# Patient Record
Sex: Female | Born: 1975 | Race: Black or African American | Hispanic: No | Marital: Single | Smoking: Former smoker
Health system: Southern US, Community
[De-identification: ages and names within clinical notes are randomized; demographics above are authoritative.]

## PROBLEM LIST (undated history)

## (undated) DIAGNOSIS — Z789 Other specified health status: Secondary | ICD-10-CM

## (undated) DIAGNOSIS — F419 Anxiety disorder, unspecified: Secondary | ICD-10-CM

## (undated) DIAGNOSIS — G4730 Sleep apnea, unspecified: Secondary | ICD-10-CM

## (undated) HISTORY — DX: Morbid (severe) obesity due to excess calories: E66.01

## (undated) HISTORY — DX: Anxiety disorder, unspecified: F41.9

## (undated) HISTORY — PX: LEEP: SHX91

---

## 2004-02-08 ENCOUNTER — Other Ambulatory Visit: Admission: RE | Admit: 2004-02-08 | Discharge: 2004-02-08 | Payer: Self-pay | Admitting: Gynecology

## 2004-09-08 ENCOUNTER — Other Ambulatory Visit: Admission: RE | Admit: 2004-09-08 | Discharge: 2004-09-08 | Payer: Self-pay | Admitting: Gynecology

## 2006-10-29 ENCOUNTER — Encounter: Admission: RE | Admit: 2006-10-29 | Discharge: 2006-10-29 | Payer: Self-pay | Admitting: Internal Medicine

## 2007-07-23 ENCOUNTER — Inpatient Hospital Stay (HOSPITAL_COMMUNITY): Admission: AD | Admit: 2007-07-23 | Discharge: 2007-07-26 | Payer: Self-pay | Admitting: Obstetrics and Gynecology

## 2007-09-27 ENCOUNTER — Emergency Department (HOSPITAL_COMMUNITY): Admission: EM | Admit: 2007-09-27 | Discharge: 2007-09-27 | Payer: Self-pay | Admitting: Emergency Medicine

## 2010-05-27 NOTE — Discharge Summary (Signed)
NAMEALLAYA, Rhonda Murray               ACCOUNT NO.:  [id number]   MEDICAL RECORD NO.:  [id number]          PATIENT TYPE:  INP   LOCATION:  9110                          FACILITY:  [facility]   PHYSICIAN:  Malva Limes, M.D.    DATE OF BIRTH:  [birth year]   DATE OF ADMISSION:  07/23/2007  DATE OF DISCHARGE:  07/26/2007                               DISCHARGE SUMMARY   FINAL DIAGNOSES:  1. Intrauterine gestation at 38-1/2 weeks.  2. Positive group B streptococcus.  3. Active labor.   PROCEDURE:  Vacuum-assisted vaginal delivery of a female infant with  Apgars of 9 and 9.  Delivery performed by Dr. Malva Limes.   COMPLICATIONS:  None.   This 35 year old G1, P0 presents at 38-1/2 weeks' gestation in early  labor.  The patient's antepartum course up to this point had been  uncomplicated.  She did have a positive group B strep culture obtained  in our office at 36 weeks.  The patient also had some slightly elevated  blood pressures upon admission but normal lab work and no protein in her  urine.  The patient was started on antibiotics for her positive group B  strep status.  The patient dilated to complete.  She did have some  variable decelerations and during the second stage of labor had a deep  variable deceleration.  To shorten the stage of labor, Dr. Dareen Piano used  a vacuum extractor at +3 station.  She had delivery of a 7-pound 6-ounce  female infant with Apgars of 9 and 9 over a third-degree midline  episiotomy.  There was a body cord x1.  The delivery went without  complications.  The patient's postpartum course was benign without any  significant fevers.  She did want her little boy circumcised prior to  discharge.  The patient was felt ready for discharge on postpartum day  #2.  She was able to be sent home on a regular diet, told to decrease  activities, was given Percocet 1-2 every 4 hours as needed for pain,  told she could use over-the-counter ibuprofen up to 600 mg every 6 hours  as  needed for pain, was told she could use over-the-counter stool  softeners, was to follow up in our office in 4 weeks.  Instructions and  precautions were reviewed with the patient.   LABORATORIES ON DISCHARGE:  The patient had a hemoglobin of 11.1, white  blood cell count of 11.3, and platelets of 193,000 and like I said  prior, a normal [facility] panel.      Leilani Able, P.A.-C.      ______________________________  Malva Limes, M.D.    MB/MEDQ  D:  08/22/2007  T:  08/23/2007  Job:  [id number]

## 2010-10-06 LAB — URINALYSIS, ROUTINE W REFLEX MICROSCOPIC
Bilirubin Urine: NEGATIVE
Glucose, UA: NEGATIVE
Ketones, ur: NEGATIVE
Nitrite: NEGATIVE
Protein, ur: NEGATIVE
Urobilinogen, UA: 0.2

## 2010-10-06 LAB — URINE MICROSCOPIC-ADD ON

## 2010-10-06 LAB — COMPREHENSIVE METABOLIC PANEL
AST: 23
Albumin: 2.7 — ABNORMAL LOW
Alkaline Phosphatase: 147 — ABNORMAL HIGH
CO2: 21
Chloride: 104
Creatinine, Ser: 0.88
GFR calc Af Amer: >60
Sodium: 134 — ABNORMAL LOW
Total Bilirubin: 0.6

## 2010-10-06 LAB — RPR: RPR Ser Ql: NONREACTIVE

## 2010-10-06 LAB — CBC
MCHC: 33.6
RDW: 15.1

## 2010-10-07 LAB — CBC
HCT: 33.4 — ABNORMAL LOW
MCV: 94.4
Platelets: 193

## 2012-11-26 ENCOUNTER — Ambulatory Visit
Admission: RE | Admit: 2012-11-26 | Discharge: 2012-11-26 | Disposition: A | Discharge status: Home / Self Care | Payer: 59 | Source: Ambulatory Visit | Attending: Internal Medicine | Admitting: Internal Medicine

## 2012-11-26 ENCOUNTER — Other Ambulatory Visit: Payer: Self-pay | Admitting: Internal Medicine

## 2012-11-26 DIAGNOSIS — R110 Nausea: Secondary | ICD-10-CM

## 2012-11-26 DIAGNOSIS — K921 Melena: Secondary | ICD-10-CM

## 2012-11-26 DIAGNOSIS — R109 Unspecified abdominal pain: Secondary | ICD-10-CM

## 2012-11-26 MED ORDER — IOHEXOL 300 MG/ML  SOLN
100.0000 mL | Freq: Once | INTRAMUSCULAR | Status: AC | PRN
  Administered 2012-11-26: 100 mL via INTRAVENOUS

## 2012-11-26 MED ORDER — IOHEXOL 300 MG/ML  SOLN
40.0000 mL | Freq: Once | INTRAMUSCULAR | Status: AC | PRN
  Administered 2012-11-26: 40 mL via ORAL

## 2012-12-11 ENCOUNTER — Other Ambulatory Visit: Payer: Self-pay | Admitting: Family

## 2012-12-11 ENCOUNTER — Ambulatory Visit (INDEPENDENT_AMBULATORY_CARE_PROVIDER_SITE_OTHER): Payer: 59 | Admitting: Family

## 2012-12-11 ENCOUNTER — Encounter: Payer: Self-pay | Admitting: Family

## 2012-12-11 VITALS — BP 118/70 | HR 98 | Ht 67.0 in | Wt 261.0 lb

## 2012-12-11 DIAGNOSIS — R1084 Generalized abdominal pain: Secondary | ICD-10-CM

## 2012-12-11 DIAGNOSIS — N926 Irregular menstruation, unspecified: Secondary | ICD-10-CM

## 2012-12-11 LAB — CBC WITH DIFFERENTIAL/PLATELET
Eosinophils Absolute: 0.1 10*3/uL (ref 0.0–0.7)
Eosinophils Relative: 0.9 % (ref 0.0–5.0)
HCT: 39.8 % (ref 36.0–46.0)
Hemoglobin: 13.2 g/dL (ref 12.0–15.0)
Lymphocytes Relative: 25.2 % (ref 12.0–46.0)
Lymphs Abs: 1.9 10*3/uL (ref 0.7–4.0)
MCHC: 33.3 g/dL (ref 30.0–36.0)
Monocytes Relative: 5 % (ref 3.0–12.0)
Neutrophils Relative %: 68.6 % (ref 43.0–77.0)
Platelets: 281 10*3/uL (ref 150.0–400.0)

## 2012-12-11 LAB — BASIC METABOLIC PANEL
CO2: 24 mEq/L (ref 19–32)
Creatinine, Ser: 0.8 mg/dL (ref 0.4–1.2)
Glucose, Bld: 81 mg/dL (ref 70–99)
Potassium: 3.1 mEq/L — ABNORMAL LOW (ref 3.5–5.1)
Sodium: 135 mEq/L (ref 135–145)

## 2012-12-11 LAB — LIPID PANEL
Cholesterol: 174 mg/dL (ref 0–200)
LDL Cholesterol: 95 mg/dL (ref 0–99)
Total CHOL/HDL Ratio: 3
Triglycerides: 102 mg/dL (ref 0.0–149.0)

## 2012-12-11 LAB — HEPATIC FUNCTION PANEL
ALT: 21 U/L (ref 0–35)
Total Bilirubin: 0.7 mg/dL (ref 0.3–1.2)
Total Protein: 8.1 g/dL (ref 6.0–8.3)

## 2012-12-11 LAB — POCT URINALYSIS DIPSTICK
Bilirubin, UA: NEGATIVE
Nitrite, UA: NEGATIVE
Urobilinogen, UA: 1
pH, UA: 6.5

## 2012-12-11 MED ORDER — POTASSIUM CHLORIDE CRYS ER 20 MEQ PO TBCR: 20.0000 meq | EXTENDED_RELEASE_TABLET | Freq: Every day | ORAL | Status: DC

## 2012-12-11 NOTE — Patient Instructions (Signed)
Align once a day.   Irritable Bowel Syndrome Irritable Bowel Syndrome (IBS) is caused by a disturbance of normal bowel function. Other terms used are spastic colon, mucous colitis, and irritable colon. It does not require surgery, nor does it lead to cancer. There is no cure for IBS. But with proper diet, stress reduction, and medication, you will find that your problems (symptoms) will gradually disappear or improve. IBS is a common digestive disorder. It usually appears in late adolescence or early adulthood. Women develop it twice as often as men. CAUSES  After food has been digested and absorbed in the small intestine, waste material is moved into the colon (large intestine). In the colon, water and salts are absorbed from the undigested products coming from the small intestine. The remaining residue, or fecal material, is held for elimination. Under normal circumstances, gentle, rhythmic contractions on the bowel walls push the fecal material along the colon towards the rectum. In IBS, however, these contractions are irregular and poorly coordinated. The fecal material is either retained too long, resulting in constipation, or expelled too soon, producing diarrhea. SYMPTOMS  The most common symptom of IBS is pain. It is typically in the lower left side of the belly (abdomen). But it may occur anywhere in the abdomen. It can be felt as heartburn, backache, or even as a dull pain in the arms or shoulders. The pain comes from excessive bowel-muscle spasms and from the buildup of gas and fecal material in the colon. This pain:  Can range from sharp belly (abdominal) cramps to a dull, continuous ache.  Usually worsens soon after eating.  Is typically relieved by having a bowel movement or passing gas. Abdominal pain is usually accompanied by constipation. But it may also produce diarrhea. The diarrhea typically occurs right after a meal or upon arising in the morning. The stools are typically soft and  watery. They are often flecked with secretions (mucus). Other symptoms of IBS include:  Bloating.  Loss of appetite.  Heartburn.  Feeling sick to your stomach (nausea).  Belching  Vomiting  Gas. IBS may also cause a number of symptoms that are unrelated to the digestive system:  Fatigue.  Headaches.  Anxiety  Shortness of breath  Difficulty in concentrating.  Dizziness. These symptoms tend to come and go. DIAGNOSIS  The symptoms of IBS closely mimic the symptoms of other, more serious digestive disorders. So your caregiver may wish to perform a variety of additional tests to exclude these disorders. He/she wants to be certain of learning what is wrong (diagnosis). The nature and purpose of each test will be explained to you. TREATMENT A number of medications are available to help correct bowel function and/or relieve bowel spasms and abdominal pain. Among the drugs available are:  Mild, non-irritating laxatives for severe constipation and to help restore normal bowel habits.  Specific anti-diarrheal medications to treat severe or prolonged diarrhea.  Anti-spasmodic agents to relieve intestinal cramps.  Your caregiver may also decide to treat you with a mild tranquilizer or sedative during unusually stressful periods in your life. The important thing to remember is that if any drug is prescribed for you, make sure that you take it exactly as directed. Make sure that your caregiver knows how well it worked for you. HOME CARE INSTRUCTIONS   Avoid foods that are high in fat or oils. Some examples NFA:OZHYQ cream, butter, frankfurters, sausage, and other fatty meats.  Avoid foods that have a laxative effect, such as fruit, fruit juice,  and dairy products.  Cut out carbonated drinks, chewing gum, and "gassy" foods, such as beans and cabbage. This may help relieve bloating and belching.  Bran taken with plenty of liquids may help relieve constipation.  Keep track of what  foods seem to trigger your symptoms.  Avoid emotionally charged situations or circumstances that produce anxiety.  Start or continue exercising.  Get plenty of rest and sleep. MAKE SURE YOU:   Understand these instructions.  Will watch your condition.  Will get help right away if you are not doing well or get worse. Document Released: 12/26/2004 Document Revised: 03/20/2011 Document Reviewed: 08/16/2007 [facility] Patient Information 2014 [place], [place].

## 2012-12-11 NOTE — Addendum Note (Signed)
Addended byAdline Mango B on: 12/11/2012 03:37 PM   Modules accepted: Orders

## 2012-12-11 NOTE — Progress Notes (Signed)
   Subjective:    Patient ID: Rhonda Murray, female    DOB: June 16, 1975, 37 y.o.   MRN: [id number]  HPI 37 year old [place] American female, new patient to the practice and to be established. She was seen last month at emergency department with abdominal pain and had a CT scan of the abdomen done that showed no acute findings. It was positive for gallstones but no cholecystitis. She continues to have generalized abdominal pain she describes as a constant dull ache, mild. However, once or twice a day she'll have a sharp pain that lasts 10-15 seconds and raised the pain is 6-7/10. Has taken Aleve but doesn't really help her symptoms. Reports constipation but denies any diarrhea, blood in her stools are dark black stools. She is sexually active for one mild partner, reports always protected. Last menstrual period October 2014. The reports irregularity in her menstrual cycle. She has not had a Pap smear in 2 years.  Reports increased stress due to work.   Review of Systems  Constitutional: Negative.   HENT: Negative.   Respiratory: Negative.   Cardiovascular: Negative.   Gastrointestinal: Negative.   Endocrine: Negative.   Musculoskeletal: Negative.   Neurological: Negative.   Psychiatric/Behavioral: Negative.    History reviewed. No pertinent past medical history.  History   Social History  . Marital Status: Married    Spouse Name: N/A    Number of Children: N/A  . Years of Education: N/A   Occupational History  . Not on file.   Social History Main Topics  . Smoking status: Former Games developer  . Smokeless tobacco: Not on file  . Alcohol Use: Yes  . Drug Use: No  . Sexual Activity: Not on file   Other Topics Concern  . Not on file   Social History Narrative  . No narrative on file    History reviewed. No pertinent past surgical history.  Family History  Problem Relation Age of Onset  . Stroke Mother   . Hypertension Mother     Not on File  No current outpatient  prescriptions on file prior to visit.   No current facility-administered medications on file prior to visit.    BP 118/70  Pulse 98  Ht 5\' 7"  (1.702 m)  Wt 261 lb (118.389 kg)  BMI 40.87 kg/m2  LMP 10/13/2014chart    Objective:   Physical Exam  Constitutional: She is oriented to person, place, and time. She appears well-developed and well-nourished.  HENT:  Right Ear: External ear normal.  Left Ear: External ear normal.  Nose: Nose normal.  Mouth/Throat: Oropharynx is clear and moist.  Neck: Normal range of motion. Neck supple.  Cardiovascular: Normal rate, regular rhythm and normal heart sounds.   Pulmonary/Chest: Effort normal and breath sounds normal.  Abdominal: Soft. Bowel sounds are normal.  Musculoskeletal: Normal range of motion.  Neurological: She is alert and oriented to person, place, and time.  Skin: Skin is warm and dry.  Psychiatric: She has a normal mood and affect.          Assessment & Plan:  Assessment:  1. Generalized Abdominal Pain  2. IBS  Plan: Lab sent today to include BMP, CBC, LFTs, UA, urine pregnancy test will notify patient of results. Encouraged over-the-counter probiotic once daily. Return for complete physical exam. I believe that the abdominal pain is strongly linked to stress. Encouraged exercise. Will followup at her physical to be sure she is doing better if not refer to GI.

## 2012-12-12 LAB — URINE CULTURE: Colony Count: NO GROWTH

## 2012-12-26 ENCOUNTER — Ambulatory Visit (INDEPENDENT_AMBULATORY_CARE_PROVIDER_SITE_OTHER): Payer: 59 | Admitting: Family

## 2012-12-26 ENCOUNTER — Other Ambulatory Visit (HOSPITAL_COMMUNITY)
Admission: RE | Admit: 2012-12-26 | Discharge: 2012-12-26 | Disposition: A | Discharge status: Home / Self Care | Payer: 59 | Source: Ambulatory Visit | Attending: Family | Admitting: Family

## 2012-12-26 ENCOUNTER — Encounter: Payer: Self-pay | Admitting: Family

## 2012-12-26 VITALS — BP 118/76 | HR 87 | Ht 67.0 in | Wt 260.0 lb

## 2012-12-26 DIAGNOSIS — Z23 Encounter for immunization: Secondary | ICD-10-CM

## 2012-12-26 DIAGNOSIS — Z331 Pregnant state, incidental: Secondary | ICD-10-CM

## 2012-12-26 DIAGNOSIS — Z01419 Encounter for gynecological examination (general) (routine) without abnormal findings: Secondary | ICD-10-CM | POA: Insufficient documentation

## 2012-12-26 DIAGNOSIS — Z0000 Encounter for general adult medical examination without abnormal findings: Secondary | ICD-10-CM

## 2012-12-26 DIAGNOSIS — Z113 Encounter for screening for infections with a predominantly sexual mode of transmission: Secondary | ICD-10-CM | POA: Insufficient documentation

## 2012-12-26 DIAGNOSIS — Z3490 Encounter for supervision of normal pregnancy, unspecified, unspecified trimester: Secondary | ICD-10-CM

## 2012-12-26 DIAGNOSIS — E876 Hypokalemia: Secondary | ICD-10-CM | POA: Insufficient documentation

## 2012-12-26 DIAGNOSIS — N939 Abnormal uterine and vaginal bleeding, unspecified: Secondary | ICD-10-CM

## 2012-12-26 DIAGNOSIS — Z124 Encounter for screening for malignant neoplasm of cervix: Secondary | ICD-10-CM

## 2012-12-26 DIAGNOSIS — N898 Other specified noninflammatory disorders of vagina: Secondary | ICD-10-CM

## 2012-12-26 NOTE — Patient Instructions (Signed)
Pregnancy - First Trimester  During sexual intercourse, millions of sperm go into the vagina. Only 1 sperm will penetrate and fertilize the female egg while it is in the Fallopian tube. One week later, the fertilized egg implants into the wall of the uterus. An embryo begins to develop into a baby. At 6 to 8 weeks, the eyes and face are formed and the heartbeat can be seen on ultrasound. At the end of 12 weeks (first trimester), all the baby's organs are formed. Now that you are pregnant, you will want to do everything you can to have a healthy baby. Two of the most important things are to get good prenatal care and follow your caregiver's instructions. Prenatal care is all the medical care you receive before the baby's birth. It is given to prevent, find, and treat problems during the pregnancy and childbirth.  PRENATAL EXAMS  · During prenatal visits, your weight, blood pressure, and urine are checked. This is done to make sure you are healthy and progressing normally during the pregnancy.  · A pregnant woman should gain 25 to 35 pounds during the pregnancy. However, if you are overweight or underweight, your caregiver will advise you regarding your weight.  · Your caregiver will ask and answer questions for you.  · Blood work, cervical cultures, other necessary tests, and a Pap test are done during your prenatal exams. These tests are done to check on your health and the probable health of your baby. Tests are strongly recommended and done for HIV with your permission. This is the virus that causes AIDS. These tests are done because medicines can be given to help prevent your baby from being born with this infection should you have been infected without knowing it. Blood work is also used to find out your blood type, previous infections, and follow your blood levels (hemoglobin).  · Low hemoglobin (anemia) is common during pregnancy. Iron and vitamins are given to help prevent this. Later in the pregnancy, blood  tests for diabetes will be done along with any other tests if any problems develop.  · You may need other tests to make sure you and the baby are doing well.  CHANGES DURING THE FIRST TRIMESTER   Your body goes through many changes during pregnancy. They vary from person to person. Talk to your caregiver about changes you notice and are concerned about. Changes can include:  · Your menstrual period stops.  · The egg and sperm carry the genes that determine what you look like. Genes from you and your partner are forming a baby. The female genes determine whether the baby is a boy or a girl.  · Your body increases in girth and you may feel bloated.  · Feeling sick to your stomach (nauseous) and throwing up (vomiting). If the vomiting is uncontrollable, call your caregiver.  · Your breasts will begin to enlarge and become tender.  · Your nipples may stick out more and become darker.  · The need to urinate more. Painful urination may mean you have a bladder infection.  · Tiring easily.  · Loss of appetite.  · Cravings for certain kinds of food.  · At first, you may gain or lose a couple of pounds.  · You may have changes in your emotions from day to day (excited to be pregnant or concerned something may go wrong with the pregnancy and baby).  · You may have more vivid and strange dreams.  HOME CARE INSTRUCTIONS   ·   It is very important to avoid all smoking, alcohol and non-prescribed drugs during your pregnancy. These affect the formation and growth of the baby. Avoid chemicals while pregnant to ensure the delivery of a healthy infant.  · Start your prenatal visits by the 12th week of pregnancy. They are usually scheduled monthly at first, then more often in the last 2 months before delivery. Keep your caregiver's appointments. Follow your caregiver's instructions regarding medicine use, blood and lab tests, exercise, and diet.  · During pregnancy, you are providing food for you and your baby. Eat regular, well-balanced  meals. Choose foods such as meat, fish, milk and other low fat dairy products, vegetables, fruits, and whole-grain breads and cereals. Your caregiver will tell you of the ideal weight gain.  · You can help morning sickness by keeping soda crackers at the bedside. Eat a couple before arising in the morning. You may want to use the crackers without salt on them.  · Eating 4 to 5 small meals rather than 3 large meals a day also may help the nausea and vomiting.  · Drinking liquids between meals instead of during meals also seems to help nausea and vomiting.  · A physical sexual relationship may be continued throughout pregnancy if there are no other problems. Problems may be early (premature) leaking of amniotic fluid from the membranes, vaginal bleeding, or belly (abdominal) pain.  · Exercise regularly if there are no restrictions. Check with your caregiver or physical therapist if you are unsure of the safety of some of your exercises. Greater weight gain will occur in the last 2 trimesters of pregnancy. Exercising will help:  · Control your weight.  · Keep you in shape.  · Prepare you for labor and delivery.  · Help you lose your pregnancy weight after you deliver your baby.  · Wear a good support or jogging bra for breast tenderness during pregnancy. This may help if worn during sleep too.  · Ask when prenatal classes are available. Begin classes when they are offered.  · Do not use hot tubs, steam rooms, or saunas.  · Wear your seat belt when driving. This protects you and your baby if you are in an accident.  · Avoid raw meat, uncooked cheese, cat litter boxes, and soil used by cats throughout the pregnancy. These carry germs that can cause birth defects in the baby.  · The first trimester is a good time to visit your dentist for your dental health. Getting your teeth cleaned is okay. Use a softer toothbrush and brush gently during pregnancy.  · Ask for help if you have financial, counseling, or nutritional needs  during pregnancy. Your caregiver will be able to offer counseling for these needs as well as refer you for other special needs.  · Do not take any medicines or herbs unless told by your caregiver.  · Inform your caregiver if there is any mental or physical domestic violence.  · Make a list of emergency phone numbers of family, friends, hospital, and police and fire departments.  · Write down your questions. Take them to your prenatal visit.  · Do not douche.  · Do not cross your legs.  · If you have to stand for long periods of time, rotate you feet or take small steps in a circle.  · You may have more vaginal secretions that may require a sanitary pad. Do not use tampons or scented sanitary pads.  MEDICINES AND DRUG USE IN PREGNANCY  ·   Take prenatal vitamins as directed. The vitamin should contain 1 milligram of folic acid. Keep all vitamins out of reach of children. Only a couple vitamins or tablets containing iron may be fatal to a baby or young child when ingested.  · Avoid use of all medicines, including herbs, over-the-counter medicines, not prescribed or suggested by your caregiver. Only take over-the-counter or prescription medicines for pain, discomfort, or fever as directed by your caregiver. Do not use aspirin, ibuprofen, or naproxen unless directed by your caregiver.  · Let your caregiver also know about herbs you may be using.  · Alcohol is related to a number of birth defects. This includes fetal alcohol syndrome. All alcohol, in any form, should be avoided completely. Smoking will cause low birth rate and premature babies.  · Street or illegal drugs are very harmful to the baby. They are absolutely forbidden. A baby born to an addicted mother will be addicted at birth. The baby will go through the same withdrawal an adult does.  · Let your caregiver know about any medicines that you have to take and for what reason you take them.  SEEK MEDICAL CARE IF:   You have any concerns or worries during your  pregnancy. It is better to call with your questions if you feel they cannot wait, rather than worry about them.  SEEK IMMEDIATE MEDICAL CARE IF:   · An unexplained oral temperature above 102° F (38.9° C) develops, or as your caregiver suggests.  · You have leaking of fluid from the vagina (birth canal). If leaking membranes are suspected, take your temperature and inform your caregiver of this when you call.  · There is vaginal spotting or bleeding. Notify your caregiver of the amount and how many pads are used.  · You develop a bad smelling vaginal discharge with a change in the color.  · You continue to feel sick to your stomach (nauseated) and have no relief from remedies suggested. You vomit blood or coffee ground-like materials.  · You lose more than 2 pounds of weight in 1 week.  · You gain more than 2 pounds of weight in 1 week and you notice swelling of your face, hands, feet, or legs.  · You gain 5 pounds or more in 1 week (even if you do not have swelling of your hands, face, legs, or feet).  · You get exposed to German measles and have never had them.  · You are exposed to fifth disease or chickenpox.  · You develop belly (abdominal) pain. Round ligament discomfort is a common non-cancerous (benign) cause of abdominal pain in pregnancy. Your caregiver still must evaluate this.  · You develop headache, fever, diarrhea, pain with urination, or shortness of breath.  · You fall or are in a car accident or have any kind of trauma.  · There is mental or physical violence in your home.  Document Released: 12/20/2000 Document Revised: 09/20/2011 Document Reviewed: 06/23/2008  [employer]® Patient Information ©2014 [employer].

## 2012-12-26 NOTE — Progress Notes (Signed)
Pre visit review using our clinic review tool, if applicable. No additional management support is needed unless otherwise documented below in the visit note. 

## 2012-12-26 NOTE — Progress Notes (Signed)
Subjective:    Patient ID: Rhonda Murray, female    DOB: 1975-11-17, 37 y.o.   MRN: [id number]  HPI 37 year old [place] American female, nonsmoker is in for a complete physical exam. At her last office visit we identified that she was pregnant. She has since been in touch with gynecology and has had an initial appointment with an RN. At that time, she reported having some vaginal spotting that they deemed normal. However, the bleeding has increased. Has mild clotting. Denies any pain. She is not currently exercising or following any particular diet. EDD July 19th, 2015   Review of Systems  Constitutional: Negative.   HENT: Negative.   Eyes: Negative.   Respiratory: Negative.   Cardiovascular: Negative.   Gastrointestinal: Negative.   Endocrine: Negative.   Genitourinary: Negative.   Musculoskeletal: Negative.   Skin: Negative.   Allergic/Immunologic: Negative.   Neurological: Negative.   Hematological: Negative.   Psychiatric/Behavioral: Negative.    History reviewed. No pertinent past medical history.  History   Social History  . Marital Status: Married    Spouse Name: N/A    Number of Children: N/A  . Years of Education: N/A   Occupational History  . Not on file.   Social History Main Topics  . Smoking status: Former Games developer  . Smokeless tobacco: Not on file  . Alcohol Use: Yes  . Drug Use: No  . Sexual Activity: Not on file   Other Topics Concern  . Not on file   Social History Narrative  . No narrative on file    History reviewed. No pertinent past surgical history.  Family History  Problem Relation Age of Onset  . Stroke Mother   . Hypertension Mother     No Known Allergies  Current Outpatient Prescriptions on File Prior to Visit  Medication Sig Dispense Refill  . potassium chloride SA (K-DUR,KLOR-CON) 20 MEQ tablet Take 1 tablet (20 mEq total) by mouth daily.  30 tablet  0   No current facility-administered medications on file prior to visit.     BP 118/76  Pulse 87  Ht 5\' 7"  (1.702 m)  Wt 260 lb (117.935 kg)  BMI 40.71 kg/m2  SpO2 99%  LMP 10/13/2014chart    Objective:   Physical Exam  Constitutional: She is oriented to person, place, and time. She appears well-developed and well-nourished.  HENT:  Head: Normocephalic and atraumatic.  Right Ear: External ear normal.  Left Ear: External ear normal.  Nose: Nose normal.  Mouth/Throat: Oropharynx is clear and moist.  Eyes: Conjunctivae and EOM are normal. Pupils are equal, round, and reactive to light.  Neck: Normal range of motion. Neck supple. No thyromegaly present.  Cardiovascular: Normal rate, regular rhythm and normal heart sounds.   Pulmonary/Chest: Effort normal and breath sounds normal.  Abdominal: Soft. Bowel sounds are normal. She exhibits no distension. There is no tenderness. There is no rebound.  Genitourinary: Vagina normal and uterus normal. No vaginal discharge found.  Musculoskeletal: Normal range of motion. She exhibits no edema and no tenderness.  Neurological: She is alert and oriented to person, place, and time. She has normal reflexes. She displays normal reflexes. No cranial nerve deficit. Coordination normal.  Skin: Skin is warm and dry.  Psychiatric: She has a normal mood and affect.          Assessment & Plan:  Assessment: 1. Complete physical exam 2. Hypokalemia 3. Vaginal bleeding 4. Pregnancy  Plan: Patient advised to contact gynecology and to let him  know that the bleeding has increased. Meanwhile, I will send a serum hCG quantitative and notify patient of results. Forward results over to gynecology. Drink plenty of water. potassium daily.

## 2012-12-27 ENCOUNTER — Other Ambulatory Visit: Payer: Self-pay | Admitting: Family

## 2012-12-27 MED ORDER — METRONIDAZOLE 500 MG PO TABS: 500.0000 mg | ORAL_TABLET | Freq: Two times a day (BID) | ORAL | Status: DC

## 2012-12-30 ENCOUNTER — Telehealth: Payer: Self-pay

## 2012-12-30 NOTE — Telephone Encounter (Signed)
Spoke with Dr. Debria Garret assistant to advise of pt's dx of trich. Pt was advised by [facility]

## 2013-11-10 ENCOUNTER — Encounter: Payer: Self-pay | Admitting: Family

## 2014-10-21 ENCOUNTER — Other Ambulatory Visit (HOSPITAL_COMMUNITY): Payer: Self-pay | Admitting: Obstetrics and Gynecology

## 2014-10-21 DIAGNOSIS — O283 Abnormal ultrasonic finding on antenatal screening of mother: Secondary | ICD-10-CM

## 2014-10-21 DIAGNOSIS — Z3A33 33 weeks gestation of pregnancy: Secondary | ICD-10-CM

## 2014-10-22 ENCOUNTER — Encounter (HOSPITAL_COMMUNITY): Payer: Self-pay

## 2014-10-22 ENCOUNTER — Ambulatory Visit (HOSPITAL_COMMUNITY)
Admission: RE | Admit: 2014-10-22 | Discharge: 2014-10-22 | Disposition: A | Discharge status: Home / Self Care | Payer: 59 | Source: Ambulatory Visit | Attending: Obstetrics and Gynecology | Admitting: Obstetrics and Gynecology

## 2014-10-22 DIAGNOSIS — Z3A33 33 weeks gestation of pregnancy: Secondary | ICD-10-CM | POA: Diagnosis not present

## 2014-10-22 DIAGNOSIS — O283 Abnormal ultrasonic finding on antenatal screening of mother: Secondary | ICD-10-CM | POA: Insufficient documentation

## 2014-10-22 HISTORY — DX: Other specified health status: Z78.9

## 2014-10-23 ENCOUNTER — Other Ambulatory Visit (HOSPITAL_COMMUNITY): Payer: Self-pay | Admitting: Obstetrics and Gynecology

## 2014-11-09 ENCOUNTER — Encounter (HOSPITAL_COMMUNITY)
Admission: AD | Disposition: A | Discharge status: Home / Self Care | Payer: Self-pay | Source: Ambulatory Visit | Attending: Obstetrics and Gynecology

## 2014-11-09 ENCOUNTER — Inpatient Hospital Stay (HOSPITAL_COMMUNITY): Payer: 59 | Admitting: Anesthesiology

## 2014-11-09 ENCOUNTER — Encounter (HOSPITAL_COMMUNITY): Payer: Self-pay

## 2014-11-09 ENCOUNTER — Inpatient Hospital Stay (HOSPITAL_COMMUNITY)
Admission: AD | Admit: 2014-11-09 | Discharge: 2014-11-12 | DRG: 765 | Disposition: A | Discharge status: Home / Self Care | Payer: 59 | Source: Ambulatory Visit | Attending: Obstetrics and Gynecology | Admitting: Obstetrics and Gynecology

## 2014-11-09 ENCOUNTER — Inpatient Hospital Stay (HOSPITAL_COMMUNITY): Payer: 59

## 2014-11-09 DIAGNOSIS — R1110 Vomiting, unspecified: Secondary | ICD-10-CM | POA: Diagnosis not present

## 2014-11-09 DIAGNOSIS — Z98891 History of uterine scar from previous surgery: Secondary | ICD-10-CM

## 2014-11-09 DIAGNOSIS — D259 Leiomyoma of uterus, unspecified: Secondary | ICD-10-CM | POA: Diagnosis present

## 2014-11-09 DIAGNOSIS — O99214 Obesity complicating childbirth: Secondary | ICD-10-CM | POA: Diagnosis present

## 2014-11-09 DIAGNOSIS — O42913 Preterm premature rupture of membranes, unspecified as to length of time between rupture and onset of labor, third trimester: Principal | ICD-10-CM | POA: Diagnosis present

## 2014-11-09 DIAGNOSIS — O321XX0 Maternal care for breech presentation, not applicable or unspecified: Secondary | ICD-10-CM | POA: Diagnosis present

## 2014-11-09 DIAGNOSIS — Z6841 Body Mass Index (BMI) 40.0 and over, adult: Secondary | ICD-10-CM

## 2014-11-09 DIAGNOSIS — Z3A35 35 weeks gestation of pregnancy: Secondary | ICD-10-CM

## 2014-11-09 DIAGNOSIS — Z87891 Personal history of nicotine dependence: Secondary | ICD-10-CM | POA: Diagnosis not present

## 2014-11-09 DIAGNOSIS — O43813 Placental infarction, third trimester: Secondary | ICD-10-CM | POA: Diagnosis present

## 2014-11-09 DIAGNOSIS — Z3689 Encounter for other specified antenatal screening: Secondary | ICD-10-CM

## 2014-11-09 LAB — URINALYSIS, ROUTINE W REFLEX MICROSCOPIC
Bilirubin Urine: NEGATIVE
Glucose, UA: NEGATIVE mg/dL
HGB URINE DIPSTICK: NEGATIVE
Ketones, ur: NEGATIVE mg/dL
Leukocytes, UA: NEGATIVE
Nitrite: NEGATIVE
PH: 6.5 (ref 5.0–8.0)
Protein, ur: NEGATIVE mg/dL
SPECIFIC GRAVITY, URINE: 1.01 (ref 1.005–1.030)
UROBILINOGEN UA: 0.2 mg/dL (ref 0.0–1.0)

## 2014-11-09 LAB — CBC
HCT: 33.3 % — ABNORMAL LOW (ref 36.0–46.0)
HEMOGLOBIN: 11.3 g/dL — AB (ref 12.0–15.0)
MCH: 29.8 pg (ref 26.0–34.0)
MCHC: 33.9 g/dL (ref 30.0–36.0)
MCV: 87.9 fL (ref 78.0–100.0)
Platelets: 239 10*3/uL (ref 150–400)
RBC: 3.79 MIL/uL — AB (ref 3.87–5.11)
RDW: 15.9 % — ABNORMAL HIGH (ref 11.5–15.5)
WBC: 10 10*3/uL (ref 4.0–10.5)

## 2014-11-09 LAB — TYPE AND SCREEN
ABO/RH(D): O POS
Antibody Screen: NEGATIVE

## 2014-11-09 LAB — ABO/RH: ABO/RH(D): O POS

## 2014-11-09 LAB — RPR: RPR: NONREACTIVE

## 2014-11-09 LAB — POCT FERN TEST: POCT FERN TEST: POSITIVE

## 2014-11-09 SURGERY — Surgical Case
Anesthesia: Spinal

## 2014-11-09 MED ORDER — FENTANYL CITRATE (PF) 100 MCG/2ML IJ SOLN
INTRAMUSCULAR | Status: AC
  Filled 2014-11-09: qty 4

## 2014-11-09 MED ORDER — DEXTROSE 5 % IV SOLN
3.0000 g | INTRAVENOUS | Status: AC
  Administered 2014-11-09: 3 g via INTRAVENOUS
  Filled 2014-11-09: qty 3000

## 2014-11-09 MED ORDER — SENNOSIDES-DOCUSATE SODIUM 8.6-50 MG PO TABS
2.0000 | ORAL_TABLET | ORAL | Status: DC
  Administered 2014-11-10 – 2014-11-11 (×2): 2 via ORAL
  Filled 2014-11-09 (×3): qty 2

## 2014-11-09 MED ORDER — FENTANYL CITRATE (PF) 100 MCG/2ML IJ SOLN
INTRAMUSCULAR | Status: DC | PRN
  Administered 2014-11-09: 20 ug via INTRATHECAL

## 2014-11-09 MED ORDER — OXYCODONE-ACETAMINOPHEN 5-325 MG PO TABS
1.0000 | ORAL_TABLET | ORAL | Status: DC | PRN
  Administered 2014-11-11: 1 via ORAL
  Filled 2014-11-09: qty 1

## 2014-11-09 MED ORDER — OXYTOCIN 10 UNIT/ML IJ SOLN
INTRAMUSCULAR | Status: AC
  Filled 2014-11-09: qty 4

## 2014-11-09 MED ORDER — PHENYLEPHRINE HCL 10 MG/ML IJ SOLN
INTRAMUSCULAR | Status: DC | PRN
  Administered 2014-11-09: 40 ug via INTRAVENOUS
  Administered 2014-11-09 (×2): 80 ug via INTRAVENOUS

## 2014-11-09 MED ORDER — DIPHENHYDRAMINE HCL 25 MG PO CAPS: 25.0000 mg | ORAL_CAPSULE | Freq: Four times a day (QID) | ORAL | Status: DC | PRN

## 2014-11-09 MED ORDER — ZOLPIDEM TARTRATE 5 MG PO TABS: 5.0000 mg | ORAL_TABLET | Freq: Every evening | ORAL | Status: DC | PRN

## 2014-11-09 MED ORDER — MENTHOL 3 MG MT LOZG: 1.0000 | LOZENGE | OROMUCOSAL | Status: DC | PRN

## 2014-11-09 MED ORDER — LACTATED RINGERS IV BOLUS (SEPSIS): 500.0000 mL | Freq: Once | INTRAVENOUS | Status: DC

## 2014-11-09 MED ORDER — SIMETHICONE 80 MG PO CHEW: 80.0000 mg | CHEWABLE_TABLET | ORAL | Status: DC | PRN

## 2014-11-09 MED ORDER — ACETAMINOPHEN 325 MG PO TABS
650.0000 mg | ORAL_TABLET | ORAL | Status: DC | PRN
Start: 2014-11-09 — End: 2014-11-12
  Administered 2014-11-09 – 2014-11-10 (×3): 650 mg via ORAL
  Filled 2014-11-09 (×3): qty 2

## 2014-11-09 MED ORDER — SIMETHICONE 80 MG PO CHEW
80.0000 mg | CHEWABLE_TABLET | ORAL | Status: DC
  Administered 2014-11-10 – 2014-11-11 (×3): 80 mg via ORAL
  Filled 2014-11-09 (×3): qty 1

## 2014-11-09 MED ORDER — MEPERIDINE HCL 25 MG/ML IJ SOLN: 6.2500 mg | INTRAMUSCULAR | Status: DC | PRN

## 2014-11-09 MED ORDER — MORPHINE SULFATE (PF) 0.5 MG/ML IJ SOLN
INTRAMUSCULAR | Status: DC | PRN
  Administered 2014-11-09: .2 mg via INTRATHECAL

## 2014-11-09 MED ORDER — LACTATED RINGERS IV BOLUS (SEPSIS)
1000.0000 mL | Freq: Once | INTRAVENOUS | Status: AC
  Administered 2014-11-09: 1000 mL via INTRAVENOUS

## 2014-11-09 MED ORDER — CITRIC ACID-SODIUM CITRATE 334-500 MG/5ML PO SOLN
30.0000 mL | Freq: Once | ORAL | Status: AC
  Administered 2014-11-09: 30 mL via ORAL

## 2014-11-09 MED ORDER — DEXAMETHASONE SODIUM PHOSPHATE 10 MG/ML IJ SOLN
INTRAMUSCULAR | Status: DC | PRN
  Administered 2014-11-09: 10 mg via INTRAVENOUS

## 2014-11-09 MED ORDER — ERYTHROMYCIN 5 MG/GM OP OINT
TOPICAL_OINTMENT | OPHTHALMIC | Status: AC
  Filled 2014-11-09: qty 1

## 2014-11-09 MED ORDER — LACTATED RINGERS IV SOLN
INTRAVENOUS | Status: DC
  Administered 2014-11-09: 08:00:00 via INTRAVENOUS

## 2014-11-09 MED ORDER — ONDANSETRON HCL 4 MG/2ML IJ SOLN: 4.0000 mg | Freq: Three times a day (TID) | INTRAMUSCULAR | Status: DC | PRN

## 2014-11-09 MED ORDER — OXYCODONE-ACETAMINOPHEN 5-325 MG PO TABS: 2.0000 | ORAL_TABLET | ORAL | Status: DC | PRN

## 2014-11-09 MED ORDER — OXYTOCIN 10 UNIT/ML IJ SOLN
40.0000 [IU] | INTRAMUSCULAR | Status: DC | PRN
  Administered 2014-11-09: 40 [IU] via INTRAVENOUS

## 2014-11-09 MED ORDER — DIBUCAINE 1 % RE OINT: 1.0000 "application " | TOPICAL_OINTMENT | RECTAL | Status: DC | PRN

## 2014-11-09 MED ORDER — NALBUPHINE HCL 10 MG/ML IJ SOLN: 5.0000 mg | Freq: Once | INTRAMUSCULAR | Status: DC | PRN

## 2014-11-09 MED ORDER — SODIUM CHLORIDE 0.9 % IJ SOLN: 3.0000 mL | INTRAMUSCULAR | Status: DC | PRN

## 2014-11-09 MED ORDER — DIPHENHYDRAMINE HCL 50 MG/ML IJ SOLN: 12.5000 mg | INTRAMUSCULAR | Status: DC | PRN

## 2014-11-09 MED ORDER — MORPHINE SULFATE (PF) 0.5 MG/ML IJ SOLN
INTRAMUSCULAR | Status: AC
  Filled 2014-11-09: qty 100

## 2014-11-09 MED ORDER — DIPHENHYDRAMINE HCL 25 MG PO CAPS: 25.0000 mg | ORAL_CAPSULE | ORAL | Status: DC | PRN

## 2014-11-09 MED ORDER — TETANUS-DIPHTH-ACELL PERTUSSIS 5-2.5-18.5 LF-MCG/0.5 IM SUSP: 0.5000 mL | Freq: Once | INTRAMUSCULAR | Status: DC

## 2014-11-09 MED ORDER — PHENYLEPHRINE 8 MG IN D5W 100 ML (0.08MG/ML) PREMIX OPTIME
INJECTION | INTRAVENOUS | Status: AC
  Filled 2014-11-09: qty 100

## 2014-11-09 MED ORDER — OXYTOCIN 40 UNITS IN LACTATED RINGERS INFUSION - SIMPLE MED
62.5000 mL/h | INTRAVENOUS | Status: AC
Start: 2014-11-09 — End: 2014-11-10

## 2014-11-09 MED ORDER — FENTANYL CITRATE (PF) 100 MCG/2ML IJ SOLN: 25.0000 ug | INTRAMUSCULAR | Status: DC | PRN

## 2014-11-09 MED ORDER — NALOXONE HCL 0.4 MG/ML IJ SOLN: 0.4000 mg | INTRAMUSCULAR | Status: DC | PRN

## 2014-11-09 MED ORDER — FAMOTIDINE IN NACL 20-0.9 MG/50ML-% IV SOLN
20.0000 mg | Freq: Once | INTRAVENOUS | Status: AC
  Administered 2014-11-09: 20 mg via INTRAVENOUS

## 2014-11-09 MED ORDER — PHENYLEPHRINE 8 MG IN D5W 100 ML (0.08MG/ML) PREMIX OPTIME
INJECTION | INTRAVENOUS | Status: DC | PRN
  Administered 2014-11-09: 60 ug/min via INTRAVENOUS

## 2014-11-09 MED ORDER — NALBUPHINE HCL 10 MG/ML IJ SOLN: 5.0000 mg | INTRAMUSCULAR | Status: DC | PRN

## 2014-11-09 MED ORDER — SIMETHICONE 80 MG PO CHEW
80.0000 mg | CHEWABLE_TABLET | Freq: Three times a day (TID) | ORAL | Status: DC
  Administered 2014-11-09 – 2014-11-12 (×5): 80 mg via ORAL
  Filled 2014-11-09 (×6): qty 1

## 2014-11-09 MED ORDER — IBUPROFEN 600 MG PO TABS: 600.0000 mg | ORAL_TABLET | Freq: Four times a day (QID) | ORAL | Status: DC | PRN

## 2014-11-09 MED ORDER — LACTATED RINGERS IV SOLN
INTRAVENOUS | Status: DC
  Administered 2014-11-09: 17:00:00 via INTRAVENOUS

## 2014-11-09 MED ORDER — SCOPOLAMINE 1 MG/3DAYS TD PT72
1.0000 | MEDICATED_PATCH | TRANSDERMAL | Status: DC
  Administered 2014-11-09: 1.5 mg via TRANSDERMAL
  Filled 2014-11-09: qty 1

## 2014-11-09 MED ORDER — KETOROLAC TROMETHAMINE 30 MG/ML IJ SOLN
30.0000 mg | Freq: Four times a day (QID) | INTRAMUSCULAR | Status: AC | PRN
  Administered 2014-11-09: 30 mg via INTRAMUSCULAR

## 2014-11-09 MED ORDER — WITCH HAZEL-GLYCERIN EX PADS: 1.0000 "application " | MEDICATED_PAD | CUTANEOUS | Status: DC | PRN

## 2014-11-09 MED ORDER — BUPIVACAINE IN DEXTROSE 0.75-8.25 % IT SOLN
INTRATHECAL | Status: DC | PRN
  Administered 2014-11-09: 1.6 mL via INTRATHECAL

## 2014-11-09 MED ORDER — NALOXONE HCL 2 MG/2ML IJ SOSY
1.0000 ug/kg/h | PREFILLED_SYRINGE | INTRAMUSCULAR | Status: DC | PRN
  Filled 2014-11-09: qty 2

## 2014-11-09 MED ORDER — KETOROLAC TROMETHAMINE 30 MG/ML IJ SOLN
INTRAMUSCULAR | Status: AC
  Filled 2014-11-09: qty 1

## 2014-11-09 MED ORDER — VITAMIN K1 1 MG/0.5ML IJ SOLN
INTRAMUSCULAR | Status: AC
  Filled 2014-11-09: qty 0.5

## 2014-11-09 MED ORDER — IBUPROFEN 600 MG PO TABS
600.0000 mg | ORAL_TABLET | Freq: Four times a day (QID) | ORAL | Status: DC
  Administered 2014-11-09 – 2014-11-12 (×11): 600 mg via ORAL
  Filled 2014-11-09 (×12): qty 1

## 2014-11-09 MED ORDER — KETOROLAC TROMETHAMINE 30 MG/ML IJ SOLN: 30.0000 mg | Freq: Four times a day (QID) | INTRAMUSCULAR | Status: AC | PRN

## 2014-11-09 MED ORDER — PRENATAL MULTIVITAMIN CH
1.0000 | ORAL_TABLET | Freq: Every day | ORAL | Status: DC
  Administered 2014-11-10 – 2014-11-11 (×2): 1 via ORAL
  Filled 2014-11-09 (×3): qty 1

## 2014-11-09 MED ORDER — LACTATED RINGERS IV SOLN
INTRAVENOUS | Status: DC | PRN
  Administered 2014-11-09: 10:00:00 via INTRAVENOUS

## 2014-11-09 MED ORDER — ONDANSETRON HCL 4 MG/2ML IJ SOLN
INTRAMUSCULAR | Status: DC | PRN
  Administered 2014-11-09: 4 mg via INTRAVENOUS

## 2014-11-09 MED ORDER — LANOLIN HYDROUS EX OINT: 1.0000 "application " | TOPICAL_OINTMENT | CUTANEOUS | Status: DC | PRN

## 2014-11-09 MED ORDER — FAMOTIDINE IN NACL 20-0.9 MG/50ML-% IV SOLN
INTRAVENOUS | Status: AC
  Administered 2014-11-09: 20 mg via INTRAVENOUS
  Filled 2014-11-09: qty 50

## 2014-11-09 MED ORDER — CITRIC ACID-SODIUM CITRATE 334-500 MG/5ML PO SOLN
ORAL | Status: AC
Start: 2014-11-09 — End: 2014-11-09
  Administered 2014-11-09: 30 mL via ORAL
  Filled 2014-11-09: qty 15

## 2014-11-09 MED ORDER — ONDANSETRON HCL 4 MG/2ML IJ SOLN
INTRAMUSCULAR | Status: AC
  Filled 2014-11-09: qty 2

## 2014-11-09 SURGICAL SUPPLY — 37 items
APL SKNCLS STERI-STRIP NONHPOA (GAUZE/BANDAGES/DRESSINGS) ×1
BENZOIN TINCTURE PRP APPL 2/3 (GAUZE/BANDAGES/DRESSINGS) ×2 IMPLANT
CLAMP CORD UMBIL (MISCELLANEOUS) IMPLANT
CLOSURE WOUND 1/2 X4 (GAUZE/BANDAGES/DRESSINGS) ×1
CLOTH BEACON ORANGE TIMEOUT ST (SAFETY) ×3 IMPLANT
DRAPE SHEET LG 3/4 BI-LAMINATE (DRAPES) IMPLANT
DRSG OPSITE POSTOP 4X10 (GAUZE/BANDAGES/DRESSINGS) ×3 IMPLANT
DURAPREP 26ML APPLICATOR (WOUND CARE) ×3 IMPLANT
ELECT REM PT RETURN 9FT ADLT (ELECTROSURGICAL) ×3
ELECTRODE REM PT RTRN 9FT ADLT (ELECTROSURGICAL) ×1 IMPLANT
EXTRACTOR VACUUM KIWI (MISCELLANEOUS) IMPLANT
GLOVE BIO SURGEON STRL SZ 6.5 (GLOVE) ×2 IMPLANT
GLOVE BIO SURGEONS STRL SZ 6.5 (GLOVE) ×1
GOWN STRL REUS W/TWL LRG LVL3 (GOWN DISPOSABLE) ×6 IMPLANT
KIT ABG SYR 3ML LUER SLIP (SYRINGE) IMPLANT
NDL HYPO 25X5/8 SAFETYGLIDE (NEEDLE) IMPLANT
NEEDLE HYPO 25X5/8 SAFETYGLIDE (NEEDLE) IMPLANT
NS IRRIG 1000ML POUR BTL (IV SOLUTION) ×3 IMPLANT
PACK C SECTION WH (CUSTOM PROCEDURE TRAY) ×3 IMPLANT
PAD OB MATERNITY 4.3X12.25 (PERSONAL CARE ITEMS) ×3 IMPLANT
PENCIL SMOKE EVAC W/HOLSTER (ELECTROSURGICAL) ×3 IMPLANT
RETRACTOR TRAXI PANNICULUS (MISCELLANEOUS) IMPLANT
RTRCTR C-SECT PINK 25CM LRG (MISCELLANEOUS) ×3 IMPLANT
STRIP CLOSURE SKIN 1/2X4 (GAUZE/BANDAGES/DRESSINGS) ×1 IMPLANT
SUT CHROMIC 1 CTX 36 (SUTURE) ×6 IMPLANT
SUT PLAIN 0 NONE (SUTURE) IMPLANT
SUT PLAIN 2 0 XLH (SUTURE) ×3 IMPLANT
SUT VIC AB 0 CT1 27 (SUTURE) ×6
SUT VIC AB 0 CT1 27XBRD ANBCTR (SUTURE) ×2 IMPLANT
SUT VIC AB 2-0 CT1 27 (SUTURE) ×3
SUT VIC AB 2-0 CT1 TAPERPNT 27 (SUTURE) ×1 IMPLANT
SUT VIC AB 3-0 CT1 27 (SUTURE)
SUT VIC AB 3-0 CT1 TAPERPNT 27 (SUTURE) IMPLANT
SUT VIC AB 4-0 KS 27 (SUTURE) ×3 IMPLANT
TOWEL OR 17X24 6PK STRL BLUE (TOWEL DISPOSABLE) ×3 IMPLANT
TRAXI PANNICULUS RETRACTOR (MISCELLANEOUS) ×2
TRAY FOLEY CATH SILVER 14FR (SET/KITS/TRAYS/PACK) ×3 IMPLANT

## 2014-11-09 NOTE — MAU Note (Signed)
Urine sent to lab @0607 

## 2014-11-09 NOTE — Anesthesia Preprocedure Evaluation (Signed)
Anesthesia Evaluation  Patient identified by MRN, date of birth, ID band  Reviewed: Allergy & Precautions, NPO status , Patient's Chart, lab work & pertinent test results  Airway Mallampati: III  TM Distance: >3 FB Neck ROM: Full    Dental no notable dental hx. (+) Teeth Intact   Pulmonary former smoker,    Pulmonary exam normal breath sounds clear to auscultation       Cardiovascular negative cardio ROS Normal cardiovascular exam Rhythm:Regular Rate:Normal     Neuro/Psych negative neurological ROS  negative psych ROS   GI/Hepatic Neg liver ROS,   Endo/Other  Morbid obesity  Renal/GU negative Renal ROS  negative genitourinary   Musculoskeletal negative musculoskeletal ROS (+)   Abdominal (+) + obese,   Peds  Hematology  (+) anemia ,   Anesthesia Other Findings   Reproductive/Obstetrics (+) Pregnancy Breech presentation 36 weeks SROM                             Anesthesia Physical Anesthesia Plan  ASA: III and emergent  Anesthesia Plan: Spinal   Post-op Pain Management:    Induction:   Airway Management Planned: Natural Airway  Additional Equipment:   Intra-op Plan:   Post-operative Plan:   Informed Consent: I have reviewed the patients History and Physical, chart, labs and discussed the procedure including the risks, benefits and alternatives for the proposed anesthesia with the patient or authorized representative who has indicated his/her understanding and acceptance.     Plan Discussed with: CRNA, Anesthesiologist and Surgeon  Anesthesia Plan Comments:         Anesthesia Quick Evaluation

## 2014-11-09 NOTE — Op Note (Signed)
Operative Note    Preoperative Diagnosis Breech presentation SROM IUP at 35 5/7 weeks Maternal obesity  Postoperative Diagnosis same  Procedure Low transverse c-section with 2 layer closure of uterus  Surgeon Paula Compton  Assistant  RNFA, Charlestine Massed  Anesthesia Spinal  Fluids: EBL  600cc UOP 100cc clear IVF 2400cc LR  Findings A viable female infant in the breech presentation  Apgars 8,9.  Weight pending.  Double nuchal cord. Neonatologist noted a cleft palate, but no other findings. Placenta with an area that looked like infarct--sent to path.  Uterus with pedunculated fibroid--3cm.  Specimen Placenta to Pathology  Procedure Note  Patient was taken to the operating room where spinal anesthesia was obtained and found to be adequate by Allis clamp test.  She was prepped and draped in the normal sterile fashion in the dorsal supine position with a leftward tilt. An appropriate time out was performed. A Pfannenstiel skin incision was then made with the scalpel and carried through to the underlying layer of fascia by sharp dissection and Bovie cautery. The fascia was nicked in the midline and the incision was extended laterally with Mayo scissors. The inferior aspect of the incision was grasped Coker clamps and dissected off the underlying rectus muscles. In a similar fashion the superior aspect was dissected off the rectus muscles. Rectus muscles were separated in the midline and the peritoneal cavity entered bluntly. The peritoneal incision was then extended both superiorly and inferiorly with careful attention to avoid both bowel and bladder. The Alexis self-retaining wound retractor was then placed within the incision and the lower uterine segment exposed. The bladder flap was developed with Metzenbaum scissors and pushed away from the lower uterine segment. The lower uterine segment was then incised in a transverse fashion and the cavity itself entered bluntly. The  incision was extended bluntly. The infant's bottom was then lifted and delivered from the incision without difficulty. The remainder of the infant delivered with the arms reduced over the chest and the head delivered in a flexed fashion.  The nose and mouth were bulb suctioned and the cord clamped and cut as well. The infant was handed off to the waiting pediatricians. The placenta was then spontaneously expressed from the uterus and the uterus cleared of all clots and debris with moist lap sponge. There was some tissue adherent to the endometrium which appeared to be infarcted placental tissue, and this was removed with a lap sponge.  The uterine incision was then repaired in 2 layers the first layer was a running locked layer 1-0 chromic and the second an imbricating layer of the same suture. The tubes and ovaries were inspected and the gutters cleared of all clots and debris. The uterine incision was inspected and found to be hemostatic. All instruments and sponges as well as the Alexis retractor were then removed from the abdomen. The rectus muscles and peritoneum were then reapproximated with a running suture of 2-0 Vicryl.  This was done with care as the patient had a prolonged episode of vomiting which obscured the field with bowel.  The fascia was then closed with 0 Vicryl in a running fashion. Subcutaneous tissue was reapproximated with 3-0 plain in a running fashion. The skin was closed with a subcuticular stitch of 4-0 Vicryl on a Keith needle and then reinforced with benzoin and Steri-Strips. At the conclusion of the procedure all instruments and sponge counts were correct. Patient was taken to the recovery room in good condition with her baby accompanying her  skin to skin.

## 2014-11-09 NOTE — Transfer of Care (Signed)
Immediate Anesthesia Transfer of Care Note  Patient: Rhonda Murray  Procedure(s) Performed: Procedure(s): CESAREAN SECTION (N/A)  Patient Location: PACU  Anesthesia Type:Spinal  Level of Consciousness: awake, alert  and oriented  Airway & Oxygen Therapy: Patient Spontanous Breathing  Post-op Assessment: Report given to RN and Post -op Vital signs reviewed and stable  Post vital signs: Reviewed and stable  Last Vitals:  Filed Vitals:   11/09/14 0840  BP: 135/71  Pulse: 83  Temp: 36.7 C  Resp: 18    Complications: No apparent anesthesia complications

## 2014-11-09 NOTE — MAU Provider Note (Signed)
History     CSN: [id number]  Arrival date and time: 11/09/14 5366   First Provider Initiated Contact with Patient 11/09/14 [phone]         Chief Complaint  Patient presents with  . Rupture of Membranes   HPI Rhonda Murray is a 39 y.o. G3P1011 at [redacted]w[redacted]d who presents for leaking of fluid.  Leaking clear fluid since 3 am this morning.  Denies vaginal bleeding or contractions.  Positive fetal movement.  Denies complications with this pregnancy.  States was breech on ultrasound 2 weeks ago.    OB History    Gravida Para Term Preterm AB TAB SAB Ectopic Multiple Living   3 1 1  1  1   1       Past Medical History  Diagnosis Date  . Medical history non-contributory     Past Surgical History  Procedure Laterality Date  . Leep      Family History  Problem Relation Age of Onset  . Stroke Mother   . Hypertension Mother     Social History  Substance Use Topics  . Smoking status: Former Research scientist (life sciences)  . Smokeless tobacco: None  . Alcohol Use: No    Allergies: No Known Allergies  Prescriptions prior to admission  Medication Sig Dispense Refill Last Dose  . metroNIDAZOLE (FLAGYL) 500 MG tablet Take 1 tablet (500 mg total) by mouth 2 (two) times daily. (Patient not taking: Reported on 10/22/2014) 14 tablet 0 Not Taking  . potassium chloride SA (K-DUR,KLOR-CON) 20 MEQ tablet Take 1 tablet (20 mEq total) by mouth daily. (Patient not taking: Reported on 10/22/2014) 30 tablet 0 Not Taking  . Prenatal Vit-Fe Fumarate-FA (PRENATAL VITAMIN PO) Take by mouth.   Taking    Review of Systems  Constitutional: Negative.   HENT: Negative.   Cardiovascular: Negative.   Gastrointestinal: Negative.   Genitourinary: Negative.    Physical Exam   Blood pressure 143/88, pulse 102, temperature 98.1 F (36.7 C), temperature source Oral, resp. rate 18, height 5\' 6"  (1.676 m), weight 289 lb 6.4 oz (131.271 kg), last menstrual period 02/17/2014, SpO2 100 %.  Physical Exam  Nursing note and  vitals reviewed. Constitutional: She is oriented to person, place, and time. She appears well-developed and well-nourished. No distress.  HENT:  Head: Normocephalic and atraumatic.  Eyes: Conjunctivae are normal. Right eye exhibits no discharge. Left eye exhibits no discharge. No scleral icterus.  Neck: Normal range of motion.  Cardiovascular: Normal rate, regular rhythm and normal heart sounds.   No murmur heard. Respiratory: Effort normal and breath sounds normal. No respiratory distress. She has no wheezes.  GI: Soft.  Neurological: She is alert and oriented to person, place, and time.  Skin: Skin is warm and dry. She is not diaphoretic.  Psychiatric: She has a normal mood and affect. Her behavior is normal. Judgment and thought content normal.   Dilation: 1 Effacement (%): 80 Cervical Position: Posterior Station: -3 Presentation: Undeterminable Exam by:: Jorje Guild NP   Fetal Tracing:  Baseline: 150 Variability: moderate Accelerations: 15x15 Decelerations: none  Toco: irregular   MAU Course  Procedures Results for orders placed or performed during the hospital encounter of 11/09/14 (from the past 24 hour(s))  Urinalysis, Routine w reflex microscopic (not at [facility])     Status: None   Collection Time: 11/09/14  6:05 AM  Result Value Ref Range   Color, Urine YELLOW YELLOW   APPearance CLEAR CLEAR   Specific Gravity, Urine 1.010 1.005 - 1.030  pH 6.5 5.0 - 8.0   Glucose, UA NEGATIVE NEGATIVE mg/dL   Hgb urine dipstick NEGATIVE NEGATIVE   Bilirubin Urine NEGATIVE NEGATIVE   Ketones, ur NEGATIVE NEGATIVE mg/dL   Protein, ur NEGATIVE NEGATIVE mg/dL   Urobilinogen, UA 0.2 0.0 - 1.0 mg/dL   Nitrite NEGATIVE NEGATIVE   Leukocytes, UA NEGATIVE NEGATIVE  Fern Test     Status: Abnormal   Collection Time: 11/09/14  6:47 AM  Result Value Ref Range   POCT Fern Test Positive = ruptured amniotic membanes     MDM Fern positive Breech presentation per ultrasound 5009-  S/w Dr. Marvel Plan. Plan for c/section.  Assessment and Plan  A:  1. Preterm premature rupture of membranes in third trimester, unspecified duration to onset of labor   2. Breech presentation on examination   3. Evaluate fetal position using ultrasound    P: Patient admitted for c/section  Breech presentation & SROM  Jorje Guild, NP  11/09/2014, 6:49 AM

## 2014-11-09 NOTE — MAU Note (Signed)
Returned from [place], monitor reapplied. No c/o. Denies any pain

## 2014-11-09 NOTE — Anesthesia Postprocedure Evaluation (Signed)
Anesthesia Post Note  Patient: Rhonda Murray  Procedure(s) Performed: Procedure(s) (LRB): CESAREAN SECTION (N/A)  Anesthesia type: Spinal  Patient location: PACU  Post pain: Pain level controlled  Post assessment: Post-op Vital signs reviewed  Last Vitals:  Filed Vitals:   11/09/14 1200  BP: 129/91  Pulse: 80  Temp: 37 C  Resp: 24    Post vital signs: Reviewed  Level of consciousness: awake  Complications: No apparent anesthesia complications

## 2014-11-09 NOTE — MAU Note (Signed)
Pt states she woke up at 0300 and her water broke-clear fluid. Denies vag bleeding. Having some irregular contractions-rates 7/10. +FM.

## 2014-11-09 NOTE — Lactation Note (Signed)
This note was copied from the chart of Rhonda Murray. Lactation Consultation Note  Lactation present for PT consult.  [name] is late preterm and has bilateral cleft palate.  Late preterm protocol initiated.  Assisted mom to hand express colostrum to be used with  the Dr. Saul Fordyce ultra preemie nipple. PT demonstrated and explained feeding to mom.  Plan is to follow the late preterm protocol. Because baby is preterm and it is rare for babies with cleft palate to get adequate nutrition exclusively from BF mom is to limit BF to 10 minutes (one side at each feeding) and to then feed EBM or formula from a bottle.  Double electric breast pump to be set up. Follow-up tomorrow.  Patient Name: Rhonda Murray [id number] Date: 11/09/2014 Reason for consult: Initial assessment;Infant < 6lbs;Late preterm infant   Maternal Data Has patient been taught Hand Expression?: Yes Does the patient have breastfeeding experience prior to this delivery?: Yes  Feeding Feeding Type: Formula Nipple Type: Other (Dr Roosvelt Harps ultra premie) Length of feed: 8 min  LATCH Score/Interventions                      Lactation Tools Discussed/Used     Consult Status Consult Status: Follow-up Date: 11/10/14 Follow-up type: In-patient    Van Clines 11/09/2014, 5:01 PM

## 2014-11-09 NOTE — H&P (Signed)
Rhonda Murray is a 39 y.o. female [id number] at 71 5/7 weeks (EDD 12/09/14 by 9 wek [place]) presenting for SROM around 330am and irregular contractions.  Prenatal care complicated by materanl obesity and some lag of head circumference on [place].  Last MFM scan 10/22/14 showed a normal EFW at 32%ile and HC lagging but not small enough to consider microcephalic.  Technically difficult study secondary to maternal obesity.  Baby is persistent breech and confirmed on [place] this AM.  Pt is AMA but declined genetic screenings.  Maternal Medical History:  Reason for admission: Rupture of membranes.   Contractions: Frequency: irregular.   Perceived severity is moderate.    Fetal activity: Perceived fetal activity is normal.    Prenatal Complications - Diabetes: none.    OB History    Gravida Para Term Preterm AB TAB SAB Ectopic Multiple Living   3 1 1  1  1   1     NSVD 2009 7#6oz SAB x 1  Past Medical History  Diagnosis Date  . Medical history non-contributory    Past Surgical History  Procedure Laterality Date  . Leep     Family History: family history includes Hypertension in her mother; Stroke in her mother. Social History:  reports that she has quit smoking. She does not have any smokeless tobacco history on file. She reports that she does not drink alcohol or use illicit drugs.   Prenatal Transfer Tool  Maternal Diabetes: No Genetic Screening: Declined Maternal Ultrasounds/Referrals: Abnormal:  Findings:   Other:  HC with slight lag, not microcephaly Fetal Ultrasounds or other Referrals:  Referred to Materal Fetal Medicine  Maternal Substance Abuse:  No Significant Maternal Medications:  None Significant Maternal Lab Results:  None Other Comments:  None  Review of Systems  Gastrointestinal: Negative for abdominal pain.  Neurological: Negative for headaches.    Dilation: 1 Effacement (%): 80 Station: -3 Exam by:: Jorje Guild NP Blood pressure 120/73, pulse 90, temperature 98.1 F  (36.7 C), temperature source Oral, resp. rate 18, height 5\' 6"  (1.676 m), weight 131.271 kg (289 lb 6.4 oz), last menstrual period 02/17/2014, SpO2 100 %. Maternal Exam:  Uterine Assessment: Contraction strength is moderate.  Contraction frequency is irregular.   Abdomen: Fetal presentation: breech  Introitus: Normal vulva. Normal vagina.    Physical Exam  Constitutional: She appears well-developed and well-nourished.  Cardiovascular: Normal rate and regular rhythm.   Respiratory: Effort normal.  GI: Soft.  obese  Genitourinary: Vagina normal.  Neurological: She is alert.  Psychiatric: She has a normal mood and affect.    Prenatal labs: ABO, Rh:  O positive Antibody:  negative Rubella:  Immune RPR:   Neg HBsAg:   neg HIV:  NR  GBS:   unknown One hour GTT 133 CF negative Declined genetics  Assessment/Plan: Pt with PROM and breech presentation.  D/w pt that she will need c-section and d/w her risks and benefits of c-section including, bleeding infection and possible damage to bowel and bladder.  Pt agrees to proceed.  Will d/w OR and proceed when able.  Last ate 2100pm 10/30 and last drank 4am, juice.   Logan Bores 11/09/2014, 7:50 AM

## 2014-11-10 ENCOUNTER — Encounter (HOSPITAL_COMMUNITY): Payer: Self-pay | Admitting: Obstetrics and Gynecology

## 2014-11-10 LAB — CBC
HCT: 30.2 % — ABNORMAL LOW (ref 36.0–46.0)
HEMOGLOBIN: 9.8 g/dL — AB (ref 12.0–15.0)
MCH: 29 pg (ref 26.0–34.0)
MCHC: 32.5 g/dL (ref 30.0–36.0)
MCV: 89.3 fL (ref 78.0–100.0)
Platelets: 235 10*3/uL (ref 150–400)
RBC: 3.38 MIL/uL — AB (ref 3.87–5.11)
RDW: 16 % — ABNORMAL HIGH (ref 11.5–15.5)
WBC: 13.5 10*3/uL — ABNORMAL HIGH (ref 4.0–10.5)

## 2014-11-10 NOTE — Progress Notes (Signed)
CSW attempted again to meet with MOB.  She was not in her room.  CSW checked at bedside and parents were talking with PT.  CSW will attempt again at a later time.

## 2014-11-10 NOTE — Lactation Note (Addendum)
This note was copied from the chart of Rhonda Murray. Lactation Consultation Note  Patient Name: Rhonda Shereta Crothers [id number] Date: 11/10/2014 Reason for consult: Follow-up assessment;NICU baby  NICU baby 58 hours old. Baby has cleft palate and was transferred from [facility] to NICU. Met with mom first in NICU where mom stated that she needed assistance with pumping. Met mom in her room on MBU. Assisted mom to hand express with colostrum present. Discussed normal progression of milk coming to volume. Enc mom to pump 8 times/24 hours for 15 minutes, followed by hand expression. Enc taking EBM to NICU as able. Enc STS and nuzzling at breast to enc a good milk supply. Mom given [facility] brochure, aware of OP/BFSG and [place] phone line assistance after D/C. Enc mom to sleep tonight and to make sure to relax in bed with feed up while pumping. Assisted mom to get comfortable and use DEBP. Enc mom to call out for assistance as needed.  Maternal Data    Feeding    LATCH Score/Interventions                      Lactation Tools Discussed/Used Pump Review: Setup, frequency, and cleaning;Milk Storage Initiated by:: bedside RN Date initiated:: 11/09/14   Consult Status Consult Status: Follow-up Date: 11/11/14 Follow-up type: In-patient    Inocente Salles 11/10/2014, 7:02 PM

## 2014-11-10 NOTE — Progress Notes (Signed)
CSW attempted to meet with MOB in her first floor room to offer support and complete assessment, but she was not available at this time.  CSW will attempt again at a later time.

## 2014-11-10 NOTE — Progress Notes (Signed)
Subjective: Postpartum Day #1: Cesarean Delivery Patient reports incisional pain, tolerating PO and no problems voiding.    Objective: Vital signs in last 24 hours: Temp:  [97.7 F (36.5 C)-98.6 F (37 C)] 98.1 F (36.7 C) (11/01 0500) Pulse Rate:  [72-110] 78 (11/01 0500) Resp:  [18-30] 18 (11/01 0500) BP: (88-147)/(63-91) 119/64 mmHg (11/01 0500) SpO2:  [97 %-100 %] 100 % (11/01 0500)  Physical Exam:  General: alert Lochia: appropriate Uterine Fundus: firm Incision: dressing C/D/I   Recent Labs  11/09/14 0810 11/10/14 0520  HGB 11.3* 9.8*  HCT 33.3* 30.2*    Assessment/Plan: Status post Cesarean section. Doing well postoperatively.  Continue current care, ambulate, encouraged to go see baby in NICU and work on breast feeding/pumping.  Ahnna Dungan D 11/10/2014, 8:23 AM

## 2014-11-11 NOTE — Plan of Care (Signed)
Problem: Discharge Progression Outcomes Goal: Barriers To Progression Addressed/Resolved Outcome: Completed/Met Date Met:  11/11/14 NICU baby

## 2014-11-11 NOTE — Lactation Note (Signed)
This note was copied from the chart of Rhonda Emmilyn Crooke. Lactation Consultation Note  Patient Name: Rhonda Murray [id number] Date: 11/11/2014 Reason for consult: Follow-up assessment;NICU baby  NICU baby 36 hours old. Mom has colostrum at bedside on ice to take to NICU a little later. Mom states that she is "really motivated" to provide EBM and plans to pump "a lot." Mom states that she has been in contact with her insurance company for a personal DEBP, but will probably need a 2-week rental. Mom is expecting to be discharged 11/12/14. Mom given paperwork for DEBP rental. Enc mom to offer STS and nuzzling at breast today when visiting baby in NICU. Mom aware to take pumping kit with her when she is D/C'd. Mom aware of [place] phone line assistance after D/C.  Maternal Data    Feeding Feeding Type: Formula Nipple Type: Dr. Clement Husbands (with one-way valve ) Length of feed: 30 min  LATCH Score/Interventions                      Lactation Tools Discussed/Used     Consult Status Consult Status: Follow-up Date: 11/12/14 Follow-up type: In-patient    Inocente Salles 11/11/2014, 10:04 AM

## 2014-11-11 NOTE — Progress Notes (Signed)
Subjective: Postpartum Day 2 Cesarean Delivery Patient reports tolerating PO and no problems voiding.   Working on breastfeeding/pumping   Objective: Vital signs in last 24 hours: Temp:  [97.5 F (36.4 C)-97.8 F (36.6 C)] 97.5 F (36.4 C) (11/02 0629) Pulse Rate:  [95-100] 95 (11/02 0629) Resp:  [19] 19 (11/02 0629) BP: (130-135)/(70-73) 130/70 mmHg (11/02 0629) SpO2:  [100 %] 100 % (11/01 1822)  Physical Exam:  General: alert and cooperative Lochia: appropriate Uterine Fundus: firm Incision: C/D/I    Recent Labs  11/09/14 0810 11/10/14 0520  HGB 11.3* 9.8*  HCT 33.3* 30.2*    Assessment/Plan: Status post Cesarean section. Doing well postoperatively.  Continue current care. Baby in NICU with feeding tube, working on finding right nipple with cleft palate Tylan Kinn W 11/11/2014, 8:41 AM

## 2014-11-11 NOTE — Clinical Social Work Maternal (Signed)
CLINICAL SOCIAL WORK MATERNAL/CHILD NOTE  Patient Details  Name: Rhonda Murray MRN: [id number] Date of Birth: 12/17/1975  Date:  11/11/2014  Clinical Social Worker Initiating Note:  Shruti Arrey E. Brigitte Pulse, [place] Date/ Time Initiated:  11/11/14/0915     Child's Name:  Skyler    Legal Guardian:   (Parents)   Need for Interpreter:  None   Date of Referral:        Reason for Referral:   (No referral-NICU admission)   Referral Source:      Address:  [address] [place], [place] [address]  Phone number:  [phone]   Household Members:  Spouse, Minor Children (MOB states she has one other child-Kamren/age 1)   Natural Supports (not living in the home):  Immediate Family   Professional Supports:     Employment:     Type of Work:  (MOB states she works at [employer])   Education:      Pensions consultant:  Multimedia programmer   Other Resources:      Cultural/Religious Considerations Which May Impact Care: None stated  Strengths:  Ability to meet basic needs , Compliance with medical plan , Home prepared for child , Understanding of illness, Engineer, materials  (Pediatric follow up will be at [facility])   Risk Factors/Current Problems:  Hume Concerns  (MOB reports a history of panic attacks)   Cognitive State:  Alert , Linear Thinking , Goal Oriented , Insightful    Mood/Affect:  Calm , Comfortable , Relaxed , Interested    CSW Assessment: CSW met with MOB in her first floor room/116 to introduce services, offer support and complete assessment due to baby's admission to NICU at 35.5 weeks with cleft palate.  MOB was pleasant and welcoming of CSW's intervention.  She appeared to be in good spirits and states she is feeling "positive."   MOb spoke openly about her emotions related to baby's diagnosis and admission to NICU.  She states she feels "sad" to be separated from her baby.  CSW validated and normalized this feeling.  She states she is  "reassured" when she calls the unit for updates, which she says she does frequently.  CSW encouraged her to call whenever she feels she needs to and to ask questions.  She seems to have a good understanding of baby's condition and states she feels comfortable with the care she is receiving.  She reports that she knows baby is where she needs to be at this point.  CSW encouraged her to focus on her baby rather than the setting or the discharge date.  CSW discussed how the separation is unnatural, but necessary and temporary.  MOB agreed.   CSW inquired about MOB's other child and how she felt during the postpartum time period after her first delivery.  MOB states she has a 13 year old son, Bennie Hind, who did not have to go to the NICU, but did need surgery around [age] year of age.  She reports he had what she said the doctors called a "gill" and required surgery to close a hole in his neck.  She states he underwent two surgeries to correct the problem; the first at [facility] and the second at [facility].  She describes this time as "stressful."  She states, however, that she feels she did well emotionally during the first few months after her son's birth.  MOB asked CSW if PPD can happen before a baby is born.  CSW provided education on perinatal  mood disorders, which include the pregnancy.  She states she has panic attacks and reports that she can always control them, but could not control them while she was pregnant.  She states she had a miscarriage in Nov. 2015 and that this pregnancy was unplanned, but that she felt "grateful" for the pregnancy.  She states her anxiety was extremely high throughout her first trimester and that she felt better emotionally in her second and third.  CSW normalized this as anxiety related to her previous loss.  She states no feelings of anxiety or depression at this time.  CSW explained signs and symptoms to watch for and asked that she call CSW and or her doctor if she has concerns  about her emotions at any time.  CSW notes that an admission to NICU, a deviation from her expectation, can heighten the possibility of experiencing PPD.  CSW asked about MOB's coping mechanisms, which she could readily provide.  She states she needs to "get air," take a walk, talk to someone, take deep breaths, and drink water when she is feeling symptoms of anxiety.  She states she tries to identify triggers to avoid and evaluate whether or not she has control over them.  CSW reminded her to use these strategies in her current situation as well.   MOB reports that she has all necessary baby items ready for baby at home.  She states the crib is "on order" and is hopeful that it will arrive before baby is discharged.  She states she also has a bassinet.  She reports having good support people in her life, mainly her husband and sister.   CSW explained ongoing support services offered by NICU CSW and gave contact information.  MOB thanked CSW and appeared very appreciative of the opportunity to tell her story and process her feelings.  CSW Plan/Description:  Patient/Family Education , Psychosocial Support and Ongoing Assessment of Needs    Alphonzo Cruise, [place] 11/11/2014, 11:00 AM

## 2014-11-12 MED ORDER — IBUPROFEN 600 MG PO TABS: 600.0000 mg | ORAL_TABLET | Freq: Four times a day (QID) | ORAL | Status: DC

## 2014-11-12 MED ORDER — OXYCODONE-ACETAMINOPHEN 5-325 MG PO TABS: 1.0000 | ORAL_TABLET | ORAL | Status: DC | PRN

## 2014-11-12 NOTE — Lactation Note (Signed)
This note was copied from the chart of Rhonda Murray. Lactation Consultation Note  Patient Name: Rhonda Murray YPPJK'D Date: 11/12/2014 Reason for consult: Follow-up assessment  NICU baby 36 hours old. Mom states that she is getting about an ounce of EBM each time she pumps. Enc mom to keep up with her pumping schedule and to hand express afterwards. Enc mom to offer STS and nuzzling when visiting baby. Discussed engorgement prevention/treatment. Mom aware of OP/BFSG and [place] phone line assistance after D/C. Mom states that she will call for DEBP later prior to D/C.   Maternal Data    Feeding    LATCH Score/Interventions                      Lactation Tools Discussed/Used     Consult Status Consult Status: PRN    Inocente Salles 11/12/2014, 10:40 AM

## 2014-11-12 NOTE — Discharge Summary (Signed)
Obstetric Discharge Summary Reason for Admission: rupture of membranes Prenatal Procedures: ultrasound Intrapartum Procedures: cesarean: low cervical, transverse Postpartum Procedures: none Complications-Operative and Postpartum: none HEMOGLOBIN  Date Value Ref Range Status  11/10/2014 9.8* 12.0 - 15.0 g/dL Final   HCT  Date Value Ref Range Status  11/10/2014 30.2* 36.0 - 46.0 % Final    Physical Exam:  General: alert and cooperative Lochia: appropriate Uterine Fundus:firm Incision:C/D/I   Discharge Diagnoses: PPROM at 35+weeks                                         Breech                                         S/p LTCS                                         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 Schedule an appointment as soon as possible for a visit in 2 weeks.   Specialty:  Obstetrics and Gynecology   Why:  incision check   Contact information:   [address] [place] [address] [phone]       Newborn Data: Live born female  Birth Weight: 5 lb 1 oz (2295 g) APGAR: 7, 9  Baby in NICU and stable, working on feeds  Ramsay Bognar W 11/12/2014, 8:29 AM

## 2014-11-12 NOTE — Progress Notes (Signed)
Subjective: Postpartum Day 3 Cesarean Delivery Patient reports tolerating PO and no problems voiding.    Objective: Vital signs in last 24 hours: Temp:  [97.6 F (36.4 C)-98 F (36.7 C)] 98 F (36.7 C) (11/03 0545) Pulse Rate:  [102-118] 102 (11/03 0545) Resp:  [19-20] 20 (11/03 0545) BP: (128-148)/(63-76) 128/76 mmHg (11/03 0545)  Physical Exam:  General: alert Lochia: appropriate Uterine Fundus: firm Incision: C/D/I    Recent Labs  11/09/14 0810 11/10/14 0520  HGB 11.3* 9.8*  HCT 33.3* 30.2*    Assessment/Plan: Status post Cesarean section. Doing well postoperatively.  D/c home  Ashaya Raftery W 11/12/2014, 7:31 AM

## 2014-11-24 ENCOUNTER — Encounter (HOSPITAL_COMMUNITY): Payer: Self-pay

## 2014-12-07 ENCOUNTER — Ambulatory Visit: Payer: Self-pay

## 2014-12-07 NOTE — Lactation Note (Signed)
This note was copied from the chart of Rhonda Brandi Tomlinson. Lactation Consultation Note  Patient Name: Rhonda Murray KSHNG'I Date: 12/07/2014 Reason for consult: Follow-up assessment;NICU baby NICU baby 24 weeks old. Mom reports that her milk supply is decreasing. Discussed mom's pumping schedule, and she has pumped 3 times in 12 hours. Enc mom to pump just before bed, and again right when when she awakens. Enc mom to pump every 2-3 hours for the rest of the day. Enc mom to bring pumping kit to NICU and use pumping rooms. Mom given sheets of information on [place], Fenugreek, and oatmeal cookie recipe. Mom was pumping over an hour at some pump sessions. Enc mom to pump 15 minutes minimum, or 2 minutes past milk flow, and to pump more often rather than for longer periods of time. Mom states that she is relieved to know what to do. Enc mom to call for assistance as needed.   Maternal Data    Feeding Feeding Type: Breast Milk Nipple Type: Dr. Roosvelt Harps level 1 (with one-way valve) Length of feed: 45 min  LATCH Score/Interventions                      Lactation Tools Discussed/Used     Consult Status Consult Status: PRN    Inocente Salles 12/07/2014, 12:02 PM

## 2014-12-08 ENCOUNTER — Ambulatory Visit: Payer: Self-pay

## 2014-12-08 NOTE — Lactation Note (Addendum)
This note was copied from the chart of Girl Miyana Mordecai. Lactation Consultation Note  Patient Name: Girl Emberlee Sortino QASUO'R Date: 12/08/2014 Reason for consult: Follow-up assessment;NICU baby  NICU baby 35 weeks old, 86w6dCGA. Met with mom in pumping room and she stated that her new pumping schedule is working "much better." Mom states that she is having some nipple soreness, so fitted mom with #21 flanges and enc using EBM and coconut oil. Enc mom to use maintenance setting on pump as well. Mom states that she is already seeing much more milk when she pumps. Enc mom to call for assistance as needed.   After completed use of DEBP, mom states that #21 flanges are much more comfortable. Maternal Data    Feeding    LATCH Score/Interventions                      Lactation Tools Discussed/Used     Consult Status Consult Status: PRN    WInocente Salles11/29/2016, 12:06 PM

## 2015-03-29 IMAGING — CT CT ABD-PELV W/ CM
2 of 4 series · 17 of 46 positions shown, 19 images · IV contrast (30CC OMNI 300 & [ID] OMNI 300)
Comparison: None.

CLINICAL DATA: Pelvic pain with nausea

EXAM:
CT ABDOMEN AND PELVIS WITH CONTRAST
TECHNIQUE: Multidetector CT imaging of the abdomen and pelvis was performed
using the standard protocol following bolus administration of
intravenous contrast.
CONTRAST:  100mL OMNIPAQUE IOHEXOL 300 MG/ML SOLN, 40mL OMNIPAQUE
IOHEXOL 300 MG/ML SOLN

[Series 2: abd/pelvis with · axial · 0.82mm/px · z∈[-406,-20]mm · 14 of 85 slices shown, 16 images]
[im 4/85  soft-tissue]
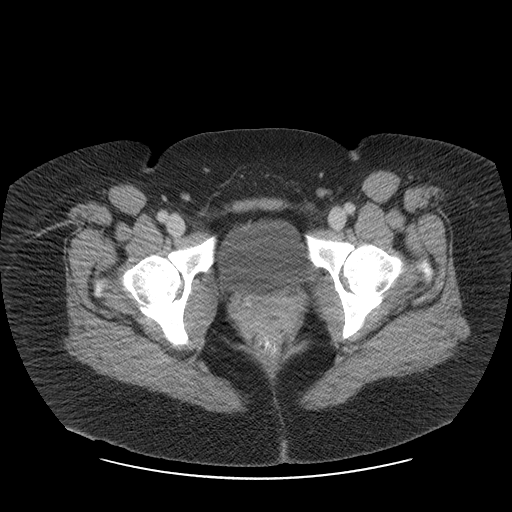
[im 4/85  bone]
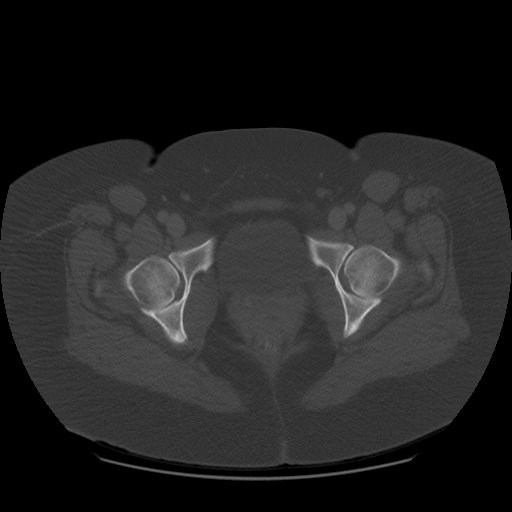
[im 11/85  soft-tissue]
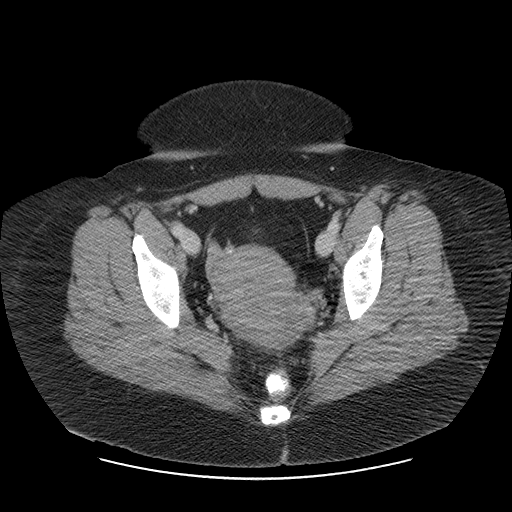
[im 15/85  soft-tissue]
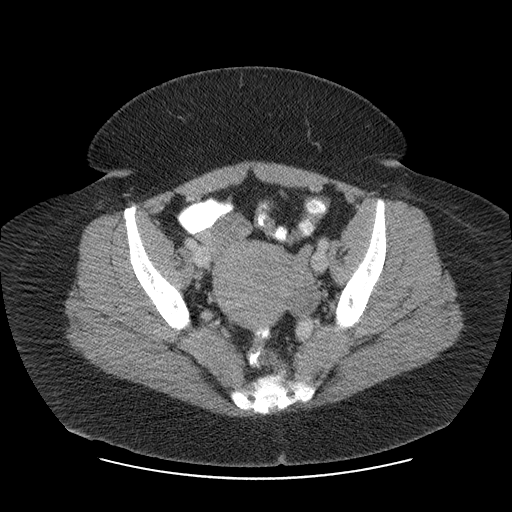
[im 22/85  soft-tissue]
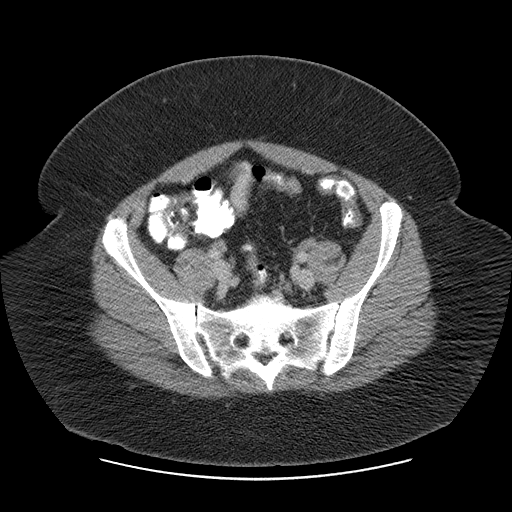
[im 30/85  soft-tissue]
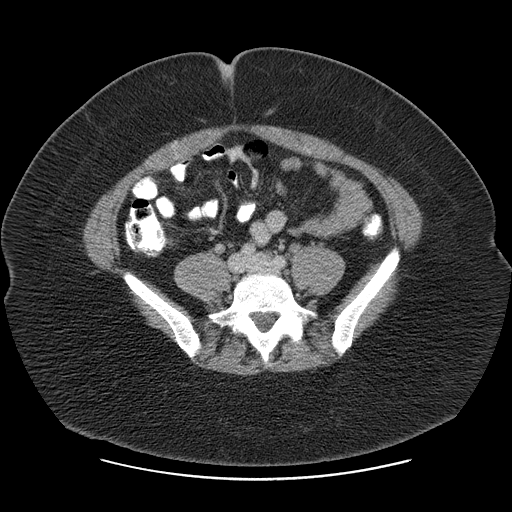
[im 33/85  soft-tissue]
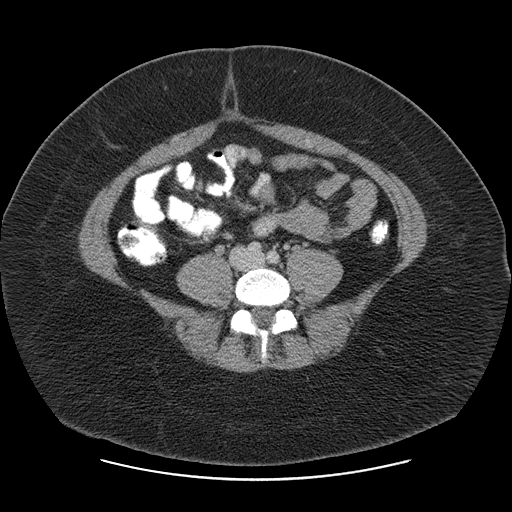
[im 41/85  soft-tissue]
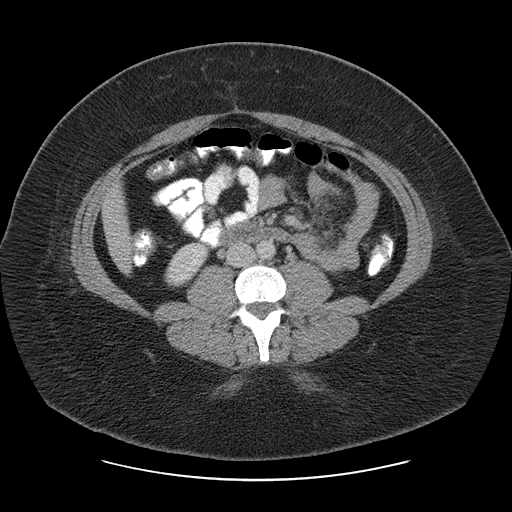
[im 44/85  soft-tissue]
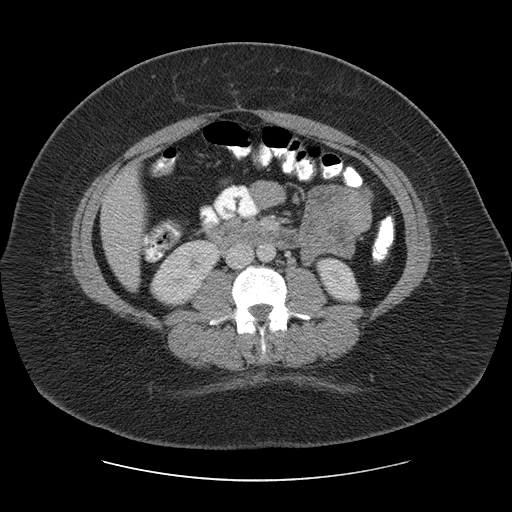
[im 52/85  soft-tissue]
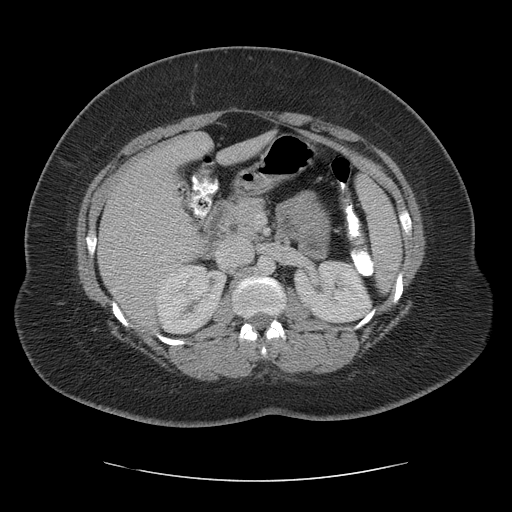
[im 52/85  bone]
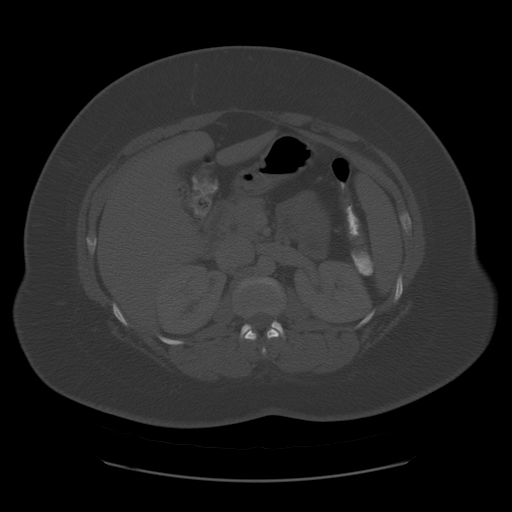
[im 55/85  soft-tissue]
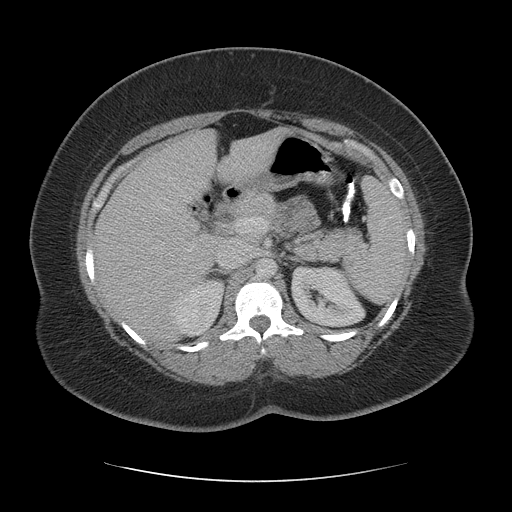
[im 63/85  soft-tissue]
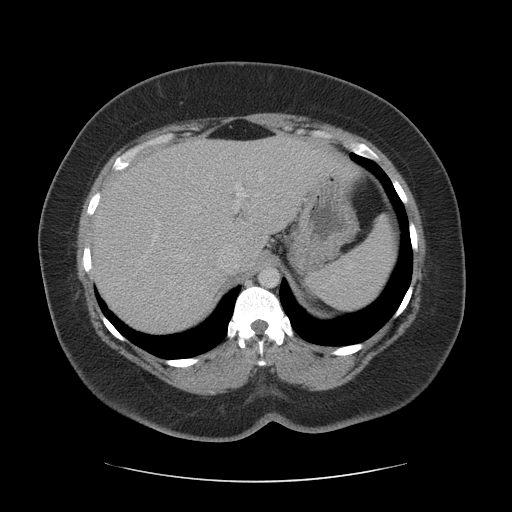
[im 70/85  soft-tissue]
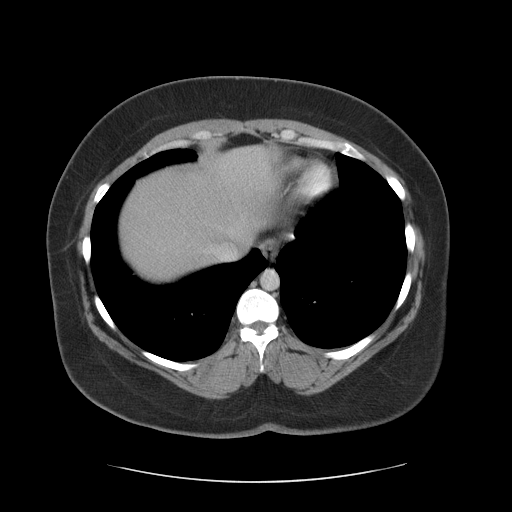
[im 74/85  soft-tissue]
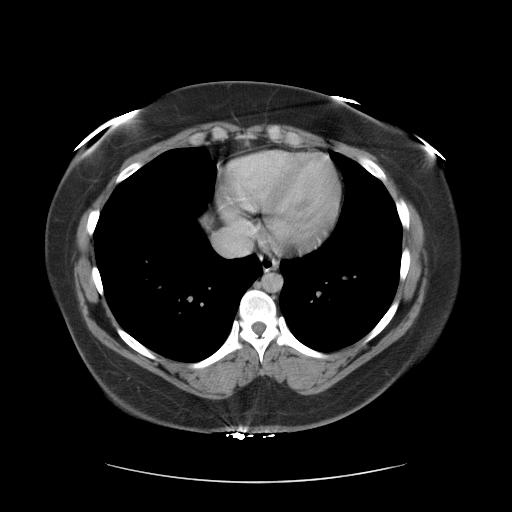
[im 81/85  soft-tissue]
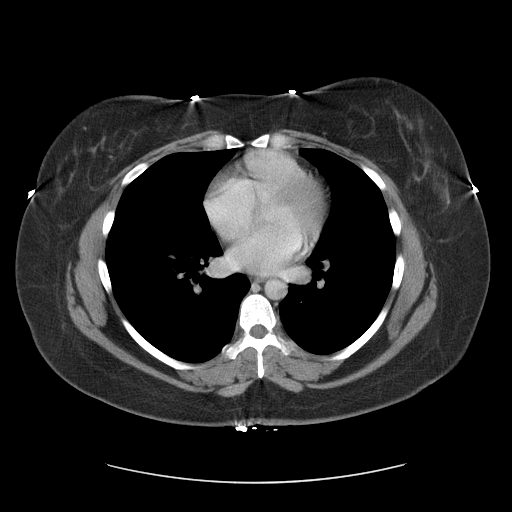

[Series 400: cor · coronal · 0.94mm/px · 3 of 139 slices shown]
[im 47/139  soft-tissue]
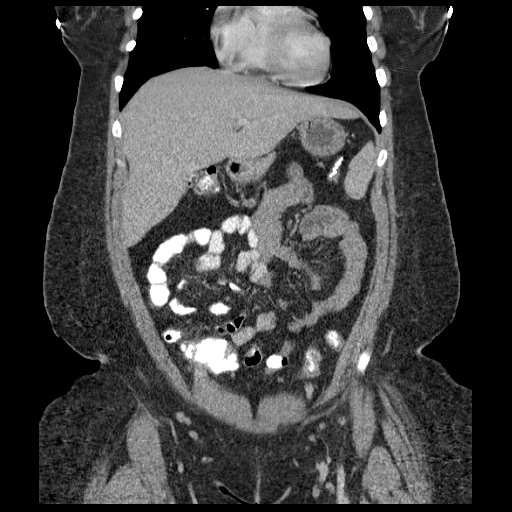
[im 62/139  soft-tissue]
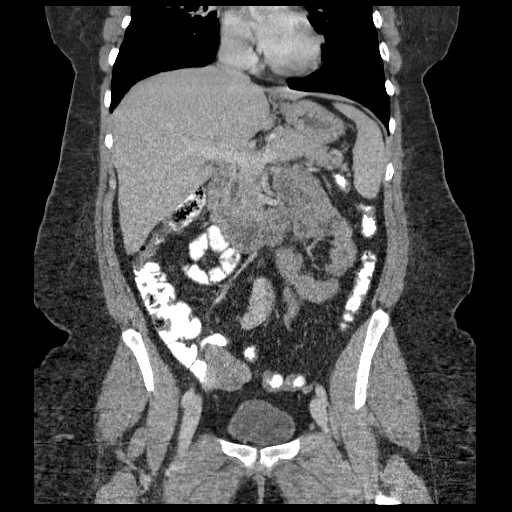
[im 77/139  soft-tissue]
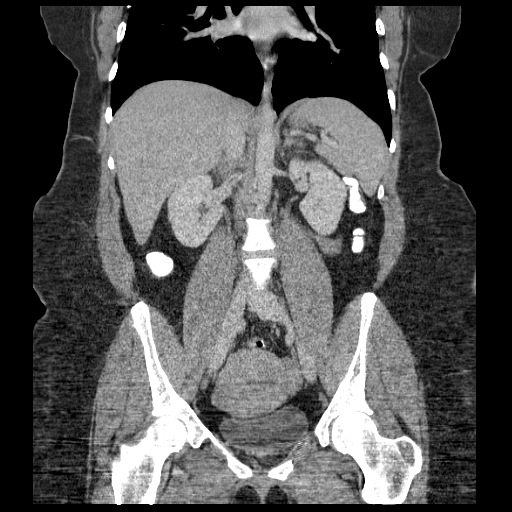

[17 of 46 positions shown; findings below may reference images not displayed]

FINDINGS: Lung bases are clear.  No pericardial fluid.

No focal hepatic lesion. The gallbladder contains multiple
gallstones. The gallbladder is collapsed around the gallstones. No
evidence of common bile duct dilatation. The pancreas, spleen,
adrenal glands, kidneys are normal.

Stomach, small bowel, and cecum are normal. The appendix is not
identified. There are no secondary signs of acute appendicitis.
Colon and rectosigmoid colon are normal.

Abdominal aorta is normal caliber. No retroperitoneal periportal
lymphadenopathy.

Uterus is normal. The right ovary is prominent measuring 3.6 x 2.7 x
3.9 cm but within normal limits for size. The left ovary measures
3.4 x 2.0 x 2.4 cm. There is a calcific density anterior to the
cornu of the uterus on the right measuring 15 mm (image 77). This
may represent a calcified exophytic leiomyoma. No free fluid the
pelvis. Bladder is normal. No pelvic lymphadenopathy. Review of bone
windows demonstrates no aggressive osseous lesions.
IMPRESSION: 1. No acute abdominal pelvic findings.
2. Appendix is not identified; however, no secondary signs of acute
appendicitis.
3. Multiple gallstones are pack ascending collapsed gallbladder.
4. Benign appearing likely dystrophic calcification adjacent to the
right cornu of the uterus may represent a calcified leiomyoma.

## 2017-02-21 IMAGING — US US MFM OB DETAIL+14 WK
2 series · 14 of 28 positions shown · non-contrast
Comparison: none

[Series 1: us mfm ob detail+14 wk · 53 acquisitions, 10 frames shown (1 of 2)]
[im 3/53]
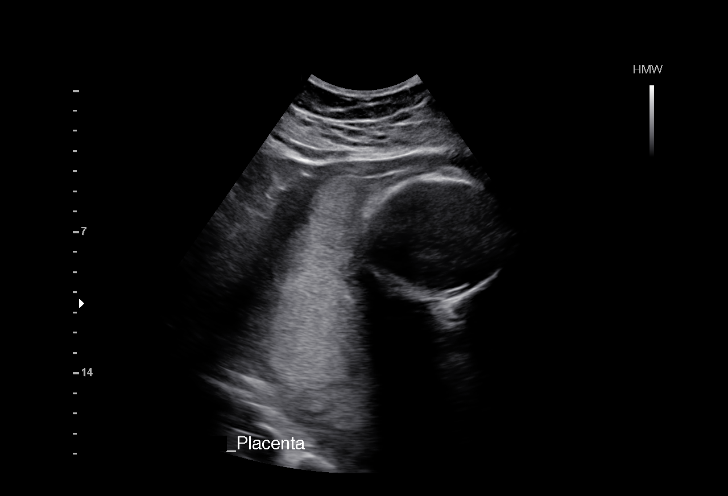
[im 8/53]
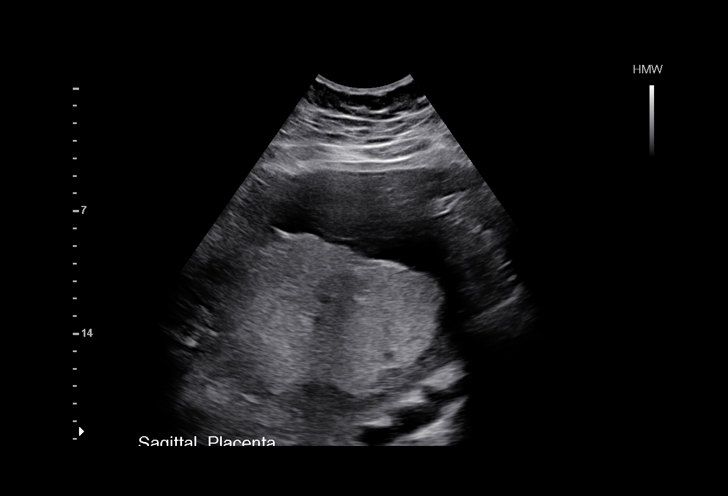
[im 14/53]
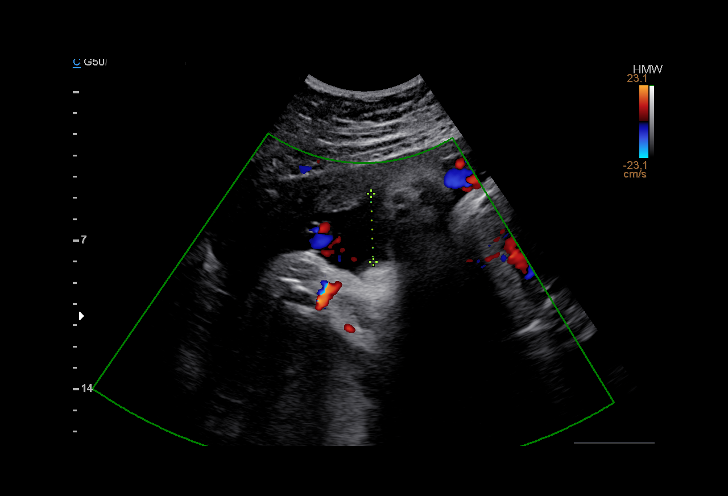
[im 19/53]
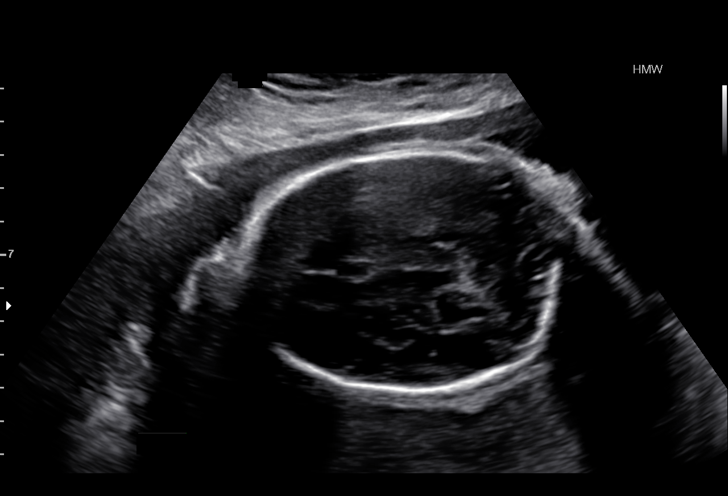
[im 24/53]
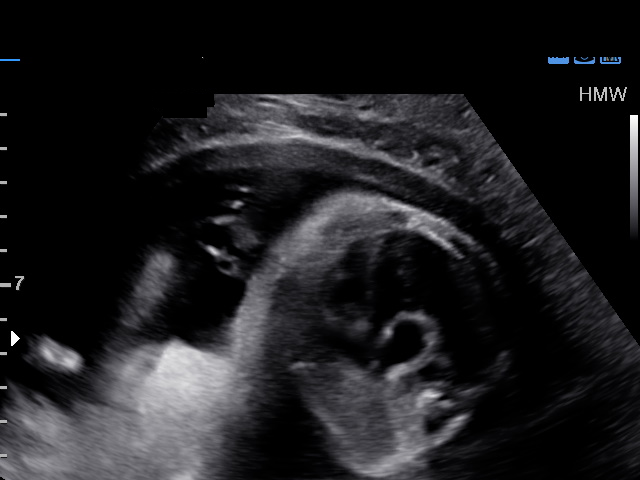
[im 29/53]
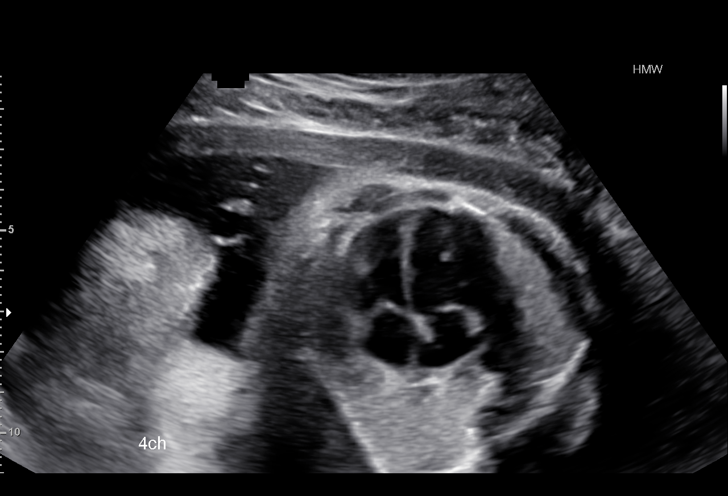
[im 34/53]
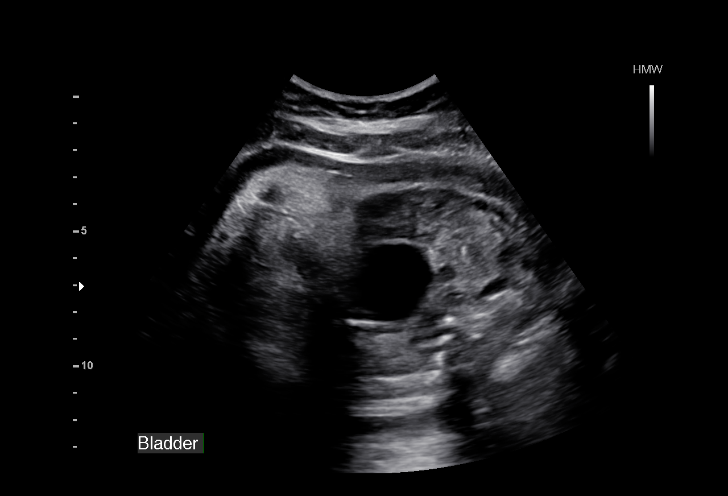
[im 40/53]
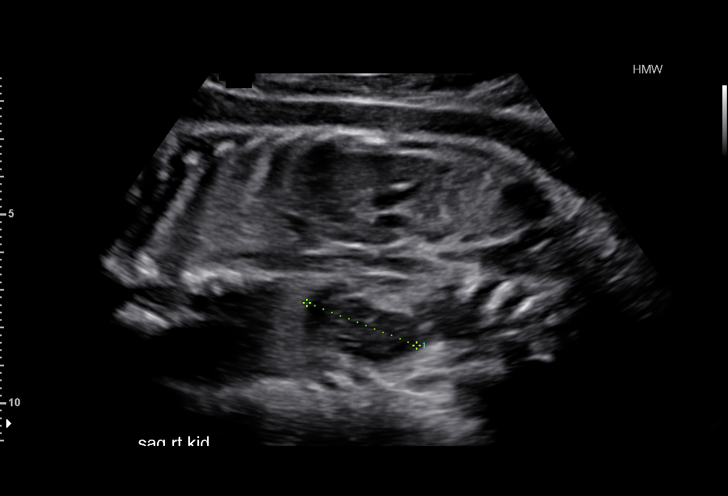
[im 45/53]
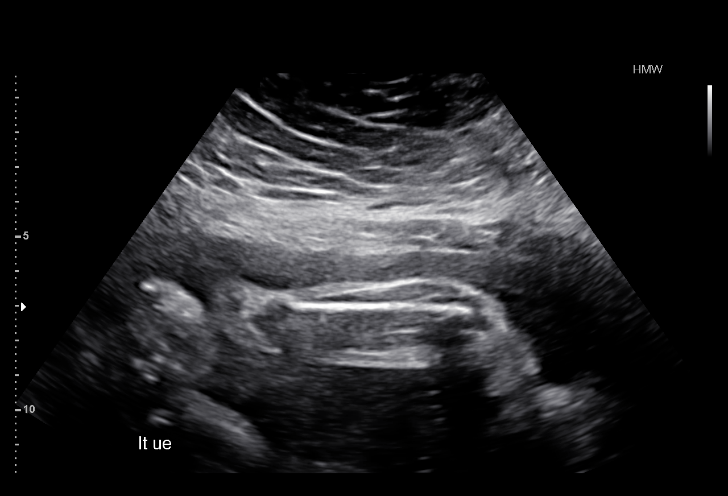
[im 50/53]
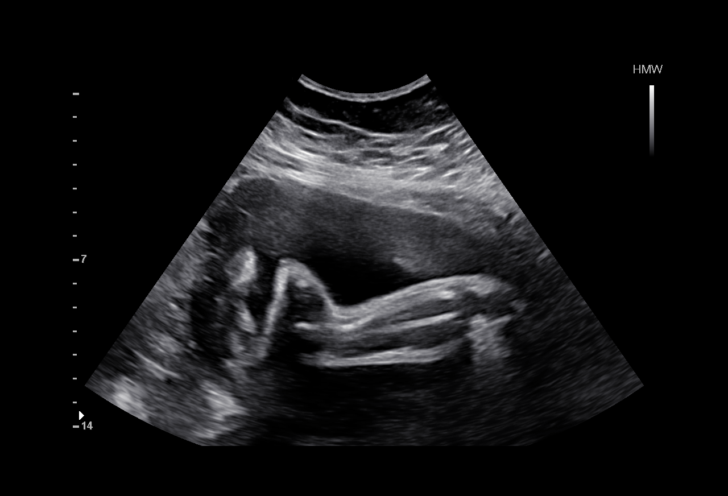

[Series 3: us mfm ob detail+14 wk · 19 acquisitions, 4 frames shown (2 of 2)]
[im 1/19]
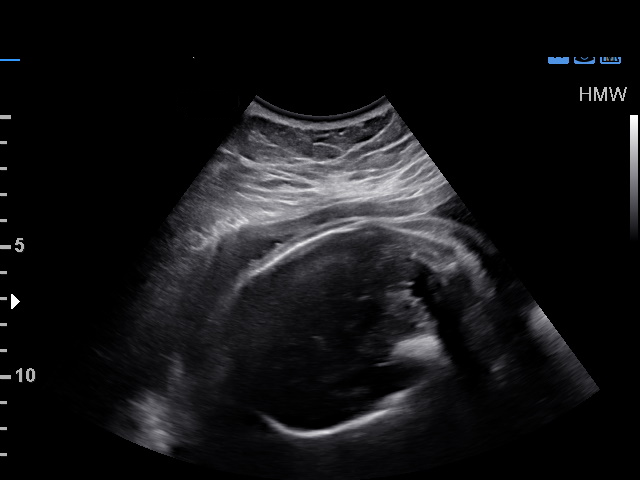
[im 7/19]
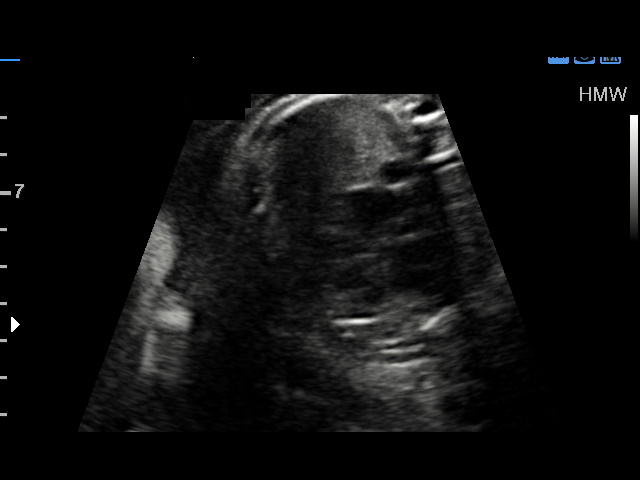
[im 13/19]
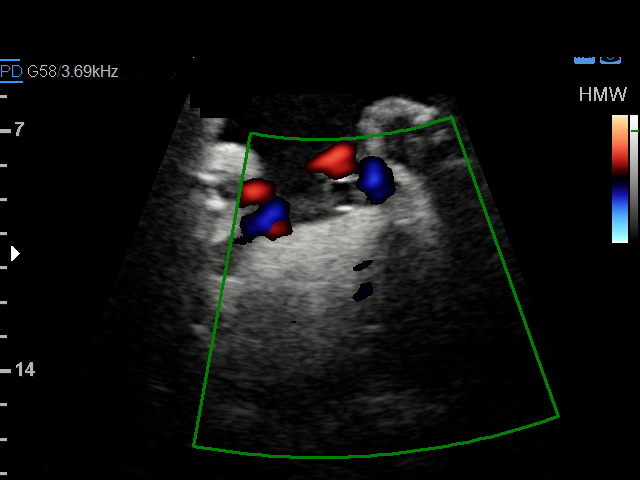
[im 19/19]
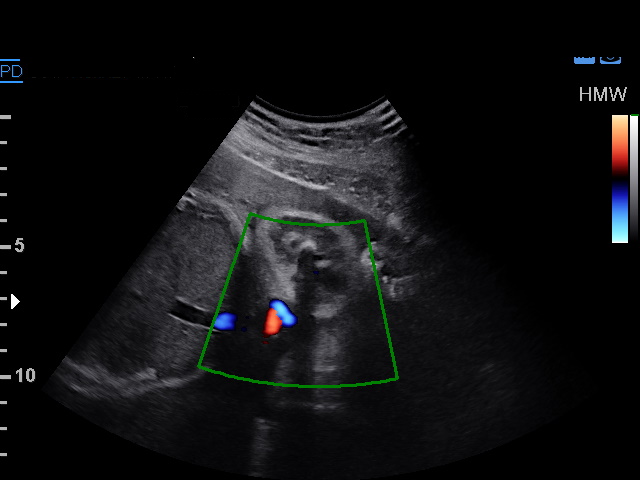

[14 of 28 positions shown; findings below may reference images not displayed]

OBSTETRICS REPORT
(Signed Final 10/25/2014 [DATE])

Service(s) Provided

Indications

33 weeks gestation of pregnancy
Maternal morbid obesity
Advanced maternal age multigravida 35+, third
trimester (declined testing)
Detailed fetal anatomic survey                        Z36
Size-Date Discrepancy
Abnormal ultrasound finding on antenatal
screening of mother (lagging head size)
Fetal Evaluation

Num Of Fetuses:    1
Cardiac Activity:  Observed
Presentation:      Breech
Placenta:          Posterior, above cervical
os
P. Cord            Not well visualized
Insertion:

Amniotic Fluid
AFI FV:      Subjectively within normal limits
AFI Sum:     15.76    cm      56  %Tile      Larg Pckt:   4.55  cm
RUQ:   3.96    cm   RLQ:    4.55   cm    LUQ:    4.04   cm    LLQ:   3.21    cm
Biometry

BPD:     72.2   mm    G. Age:  29w 0d                CI:        65.62    70 - 86
FL/HC:       21.6   19.9 -
21.5
HC:     286.1   mm    G. Age:  31w 3d       < 3  %   HC/AC:       1.02   0.96 -
1.11
AC:     280.8   mm    G. Age:  32w 1d       23   %   FL/BPD:      85.5   71 - 87
FL:      61.7   mm    G. Age:  32w 0d       14   %   FL/AC:       22.0   20 - 24
HUM:       55   mm    G. Age:  32w 0d       36   %

Est. FW:    7902   gm          4 lb     32  %
Gestational Age

LMP:           35w 2d        Date:  02/17/14                 EDD:    11/24/14
U/S Today:     31w 1d                                        EDD:    12/23/14
Best:          33w 1d     Det. By:  Early Ultrasound         EDD:    12/09/14
Anatomy

Cranium:          Appears normal         Aortic Arch:       Not well visualized
Fetal Cavum:      Appears normal         Ductal Arch:       Not well visualized
Ventricles:       Appears normal         Diaphragm:         Not well visualized
Choroid Plexus:   Not well visualized    Stomach:           Appears normal, left
sided
Cerebellum:       Appears normal         Abdomen:           Appears normal
Posterior Fossa:  Appears normal         Abdominal Wall:    Appears nml (cord
insert, abd wall)
Nuchal Fold:      Not applicable (>20    Cord Vessels:      Appears normal (3
wks GA)                                   vessel cord)
Face:             Not well visualized    Kidneys:           Appear normal
Lips:             Not well visualized    Bladder:           Appears normal
Heart:            Appears normal         Spine:             Limited views but
(4CH, axis, and                           normal
situs)
RVOT:             Appears normal         Lower              Visualized
(color)                Extremities:
LVOT:             Appears normal         Upper              Visualized
Extremities:

Other:  Technicallly difficult due to advanced GA and maternal habitus.
Technically difficult due to fetal position.
Cervix Uterus Adnexa

Cervix:       Not visualized (advanced GA >20wks)
Uterus:       No abnormality visualized.
Cul De Sac:   No free fluid seen.
Left Ovary:    Not visualized.
Right Ovary:   Not visualized.
Adnexa:     No abnormality visualized.
Impression

SIUP at 33+1 weeks
Normal detailed fetal anatomy; limited views of face, arches
and diaphragm
Normal amniotic fluid volume
Measurements consistent with early US; EFW at the 32nd
%tile

HC of 286 mm was above -2 SD below the mean so not small
enough for a diagnosis of microcephaly.
Recommendations

Follow-up ultrasounds as clinically indicated.

questions or concerns.

## 2017-07-09 DIAGNOSIS — B373 Candidiasis of vulva and vagina: Secondary | ICD-10-CM | POA: Diagnosis not present

## 2017-07-09 DIAGNOSIS — Z01419 Encounter for gynecological examination (general) (routine) without abnormal findings: Secondary | ICD-10-CM | POA: Diagnosis not present

## 2017-07-09 DIAGNOSIS — Z1231 Encounter for screening mammogram for malignant neoplasm of breast: Secondary | ICD-10-CM | POA: Diagnosis not present

## 2017-07-09 DIAGNOSIS — L293 Anogenital pruritus, unspecified: Secondary | ICD-10-CM | POA: Diagnosis not present

## 2017-10-04 ENCOUNTER — Encounter (HOSPITAL_COMMUNITY): Payer: Self-pay | Admitting: Emergency Medicine

## 2017-10-04 ENCOUNTER — Ambulatory Visit (HOSPITAL_COMMUNITY)
Admission: EM | Admit: 2017-10-04 | Discharge: 2017-10-04 | Disposition: A | Discharge status: Home / Self Care | Payer: 59 | Attending: Family Medicine | Admitting: Family Medicine

## 2017-10-04 DIAGNOSIS — J020 Streptococcal pharyngitis: Secondary | ICD-10-CM | POA: Diagnosis not present

## 2017-10-04 LAB — POCT RAPID STREP A: Streptococcus, Group A Screen (Direct): POSITIVE — AB

## 2017-10-04 MED ORDER — AMOXICILLIN 500 MG PO CAPS: 500.0000 mg | ORAL_CAPSULE | Freq: Two times a day (BID) | ORAL | 0 refills | Status: AC

## 2017-10-04 MED ORDER — IBUPROFEN 800 MG PO TABS
ORAL_TABLET | ORAL | Status: AC
  Filled 2017-10-04: qty 1

## 2017-10-04 MED ORDER — IBUPROFEN 800 MG PO TABS
800.0000 mg | ORAL_TABLET | Freq: Once | ORAL | Status: AC
  Administered 2017-10-04: 800 mg via ORAL

## 2017-10-04 NOTE — ED Triage Notes (Signed)
PT reports sore throat, chills, headache for 3 days

## 2017-10-04 NOTE — Discharge Instructions (Signed)
Push fluids to ensure adequate hydration and keep secretions thin.  Tylenol and/or ibuprofen as needed for pain or fevers.  Throat lozenges, gargles, chloraseptic spray, warm teas, popsicles etc to help with throat pain.   Complete course of antibiotics.  If symptoms worsen or do not improve in the next week to return to be seen or to follow up with your PCP.

## 2017-10-04 NOTE — ED Provider Notes (Signed)
[facility]    CSN: [id number] Arrival date & time: 10/04/17  1919     History   Chief Complaint Chief Complaint  Patient presents with  . Sore Throat    HPI Rhonda Murray is a 42 y.o. female.   Rhonda Murray presents with complaints of sore throat, headache and fevers. Symptoms originally started three days ago and they have worsened. Only occasional cough. Feels congested but no nasal drainage. Ear congestion sensation and like her equilibrium is off, but no ear pain. No gi/gu complaints. Poor intake related to pain to throat. Has been drinking fluids. No known ill contacts. Took ibuprofen last night. No medications today. Without contributing medical history.     ROS per HPI.       Past Medical History:  Diagnosis Date  . Medical history non-contributory     Patient Active Problem List   Diagnosis Date Noted  . S/P primary low transverse C-section 11/09/2014  . Hypokalemia 12/26/2012    Past Surgical History:  Procedure Laterality Date  . CESAREAN SECTION N/A 11/09/2014   Procedure: CESAREAN SECTION;  Surgeon: Paula Compton, MD;  Location: [facility];  Service: Obstetrics;  Laterality: N/A;  . LEEP      OB History    Gravida  3   Para  2   Term  1   Preterm  1   AB  1   Living  2     SAB  1   TAB      Ectopic      Multiple  0   Live Births  2            Home Medications    Prior to Admission medications   Medication Sig Start Date End Date Taking? Authorizing Provider  amoxicillin (AMOXIL) 500 MG capsule Take 1 capsule (500 mg total) by mouth 2 (two) times daily for 10 days. 10/04/17 10/14/17  Zigmund Gottron, NP    Family History Family History  Problem Relation Age of Onset  . Stroke Mother   . Hypertension Mother     Social History Social History   Tobacco Use  . Smoking status: Former Research scientist (life sciences)  . Smokeless tobacco: Never Used  Substance Use Topics  . Alcohol use: No  . Drug use: No     Allergies   Patient  has no known allergies.   Review of Systems Review of Systems   Physical Exam Triage Vital Signs ED Triage Vitals  Enc Vitals Group     BP 10/04/17 1946 (!) 141/79     Pulse Rate 10/04/17 1945 (!) 122     Resp 10/04/17 1945 16     Temp 10/04/17 1945 100 F (37.8 C)     Temp Source 10/04/17 1945 Oral     SpO2 10/04/17 1945 98 %     Weight --      Height --      Head Circumference --      Peak Flow --      Pain Score 10/04/17 1945 8     Pain Loc --      Pain Edu? --      Excl. in [place]? --    No data found.  Updated Vital Signs BP (!) 141/79   Pulse (!) 122   Temp 100 F (37.8 C) (Oral)   Resp 16   SpO2 98%   Visual Acuity Right Eye Distance:   Left Eye Distance:   Bilateral Distance:  Right Eye Near:   Left Eye Near:    Bilateral Near:     Physical Exam  Constitutional: She is oriented to person, place, and time. She appears well-developed and well-nourished. No distress.  HENT:  Head: Normocephalic and atraumatic.  Right Ear: Tympanic membrane, external ear and ear canal normal.  Left Ear: Tympanic membrane, external ear and ear canal normal.  Nose: Nose normal.  Mouth/Throat: Uvula is midline, oropharynx is clear and moist and mucous membranes are normal. Tonsils are 3+ on the right. Tonsils are 3+ on the left. No tonsillar exudate.  Eyes: Pupils are equal, round, and reactive to light. Conjunctivae and EOM are normal.  Cardiovascular: Normal rate, regular rhythm and normal heart sounds.  Pulmonary/Chest: Effort normal and breath sounds normal.  Lymphadenopathy:    She has cervical adenopathy.  Neurological: She is alert and oriented to person, place, and time.  Skin: Skin is warm and dry.     UC Treatments / Results  Labs (all labs ordered are listed, but only abnormal results are displayed) Labs Reviewed  POCT RAPID STREP A - Abnormal; Notable for the following components:      Result Value   Streptococcus, Group A Screen (Direct) POSITIVE (*)      All other components within normal limits    EKG None  Radiology No results found.  Procedures Procedures (including critical care time)  Medications Ordered in UC Medications  ibuprofen (ADVIL,MOTRIN) tablet 800 mg (has no administration in time range)    Initial Impression / Assessment and Plan / UC Course  I have reviewed the triage vital signs and the nursing notes.  Pertinent labs & imaging results that were available during my care of the patient were reviewed by me and considered in my medical decision making (see chart for details).     Positive strep here today, consistent with history and exam. Course of antibiotics initiated. Return precautions provided. If symptoms worsen or do not improve in the next week to return to be seen or to follow up with PCP.  Patient verbalized understanding and agreeable to plan.   Final Clinical Impressions(s) / UC Diagnoses   Final diagnoses:  Strep pharyngitis     Discharge Instructions     Push fluids to ensure adequate hydration and keep secretions thin.  Tylenol and/or ibuprofen as needed for pain or fevers.  Throat lozenges, gargles, chloraseptic spray, warm teas, popsicles etc to help with throat pain.   Complete course of antibiotics.  If symptoms worsen or do not improve in the next week to return to be seen or to follow up with your PCP.      ED Prescriptions    Medication Sig Dispense Auth. Provider   amoxicillin (AMOXIL) 500 MG capsule Take 1 capsule (500 mg total) by mouth 2 (two) times daily for 10 days. 20 capsule Zigmund Gottron, NP     Controlled Substance Prescriptions [place] Controlled Substance Registry consulted? Not Applicable   Zigmund Gottron, NP 10/04/17 2001

## 2017-11-05 ENCOUNTER — Encounter: Payer: Self-pay | Admitting: Nurse Practitioner

## 2017-11-05 ENCOUNTER — Ambulatory Visit (INDEPENDENT_AMBULATORY_CARE_PROVIDER_SITE_OTHER): Payer: 59 | Admitting: Nurse Practitioner

## 2017-11-05 VITALS — BP 124/74 | HR 91 | Temp 98.2°F | Ht 67.0 in | Wt 294.2 lb

## 2017-11-05 DIAGNOSIS — R1031 Right lower quadrant pain: Secondary | ICD-10-CM

## 2017-11-05 DIAGNOSIS — Z1331 Encounter for screening for depression: Secondary | ICD-10-CM | POA: Diagnosis not present

## 2017-11-05 DIAGNOSIS — Z8261 Family history of arthritis: Secondary | ICD-10-CM

## 2017-11-05 DIAGNOSIS — Z8249 Family history of ischemic heart disease and other diseases of the circulatory system: Secondary | ICD-10-CM

## 2017-11-05 DIAGNOSIS — Z832 Family history of diseases of the blood and blood-forming organs and certain disorders involving the immune mechanism: Secondary | ICD-10-CM

## 2017-11-05 DIAGNOSIS — F329 Major depressive disorder, single episode, unspecified: Secondary | ICD-10-CM | POA: Diagnosis not present

## 2017-11-05 DIAGNOSIS — F32A Depression, unspecified: Secondary | ICD-10-CM

## 2017-11-05 LAB — POCT URINALYSIS DIPSTICK
BILIRUBIN UA: NEGATIVE
GLUCOSE UA: NEGATIVE
KETONES UA: NEGATIVE
Leukocytes, UA: NEGATIVE
Nitrite, UA: NEGATIVE
PH UA: 6 (ref 5.0–8.0)
Protein, UA: NEGATIVE
SPEC GRAV UA: 1.015 (ref 1.010–1.025)
UROBILINOGEN UA: 1 U/dL

## 2017-11-05 MED ORDER — BUPROPION HCL ER (SR) 150 MG PO TB12: 150.0000 mg | ORAL_TABLET | Freq: Every day | ORAL | 2 refills | Status: DC

## 2017-11-05 NOTE — Progress Notes (Signed)
Subjective:     Patient ID: Rhonda Murray , female    DOB: March 05, 1975 , 42 y.o.   MRN: [id number]   Chief Complaint  Patient presents with  . Lower pelvic pain    pt states she has been having some pain in the lower in right abodmen for the past few weeks.    HPI  Establish care - She was referred by her friend Ezekiel Ina.  She has not been to a primary care provider in more than 5 years. She has an IUD for the last 3 years.  Seen by GYN in Sept 2019, PAP done was normal with a mammogram (normal)  and her IUD was in place at the time.  Single.  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 She has been seen [facility] last year with labs done. Alcohol occasional, non smoker.  No sexual activity for 1 month.  PMH - anxiety (in the past several years) thinks she took Xanax, she was treated for strep on 10/19/17  [facility] - Mother - HTN, Sarcodosis, arthritis.  Father - heart problems (drug problem).  Sisters - healthy.  Daughter - born with cleft palate. Son - seasonal allergies and allergy induced asthma.   Abdominal Pain  This is a new problem. The current episode started 1 to 4 weeks ago. The onset quality is sudden. The problem occurs intermittently. The most recent episode lasted 3 days. The problem has been gradually worsening. The pain is located in the RLQ. The pain is at a severity of 7/10 (no pain currently). The pain is moderate. Quality: nagging. The abdominal pain does not radiate. Pertinent negatives include no diarrhea, myalgias, nausea or vomiting. Nothing aggravates the pain. The pain is relieved by nothing. She has tried acetaminophen for the symptoms. The treatment provided no relief. There is no history of GERD, irritable bowel syndrome or pancreatitis.  Depression         This is a new problem.  The current episode started more than 1 month ago.   The onset quality is gradual.   The problem occurs every several days.  Associated symptoms include no fatigue, no  hopelessness, not irritable, no body aches, no myalgias and no suicidal ideas.  Risk factors include stress.     Past Medical History:  Diagnosis Date  . Anxiety unknown  . Medical history non-contributory       Current Outpatient Medications:  .  buPROPion (WELLBUTRIN SR) 150 MG 12 hr tablet, Take 1 tablet (150 mg total) by mouth daily., Disp: 30 tablet, Rfl: 2   No Known Allergies   Review of Systems  Constitutional: Negative.  Negative for fatigue.  HENT: Negative.   Respiratory: Negative.  Negative for cough and shortness of breath.   Cardiovascular: Negative.  Negative for palpitations.  Gastrointestinal: Positive for abdominal pain. Negative for diarrhea, nausea and vomiting.  Endocrine: Negative for polydipsia, polyphagia and polyuria.  Genitourinary: Negative.   Musculoskeletal: Negative.  Negative for myalgias.  Skin: Negative.   Neurological: Negative.   Hematological: Negative.   Psychiatric/Behavioral: Positive for depression. Negative for suicidal ideas.     Today's Vitals   11/05/17 1505  BP: 124/74  Pulse: 91  Temp: 98.2 F (36.8 C)  TempSrc: Oral  SpO2: 98%  Weight: 294 lb 3.2 oz (133.4 kg)  Height: 5\' 7"  (1.702 m)  PainSc: 0-No pain   Body mass index is 46.08 kg/m.   Objective:  Physical Exam  Constitutional: She  is oriented to person, place, and time. She appears well-developed and well-nourished. She is not irritable.  Cardiovascular: Normal rate, regular rhythm and normal heart sounds.  Pulmonary/Chest: Effort normal and breath sounds normal.  Abdominal: Soft. Bowel sounds are normal. There is tenderness. There is no rebound, no guarding, no tenderness at McBurney's point and negative Murphy's sign.    Musculoskeletal: Normal range of motion.  Neurological: She is alert and oriented to person, place, and time.  Skin: Skin is warm and dry.        Assessment And Plan:   1. Right lower quadrant abdominal pain  Mild tenderness to right  lower and upper quadrant  Negative urinalysis and urine pregnancy test  I will check for STDs as well.   - POCT Urinalysis Dipstick (81002) - CBC - CMP14 + Anion Gap - Hemoglobin A1c - Lipid Profile - TSH - Lipase  2. Depression, unspecified depression type  Depression score 12  Will start her on Wellbutrin 150 mg daily, this may also help her with weight loss.    Discussed side effects to include dry mouth and suicidal/homicidal thoughts - TSH       [name], FNP

## 2017-11-06 LAB — CBC
HEMOGLOBIN: 12.4 g/dL (ref 11.1–15.9)
Hematocrit: 37.1 % (ref 34.0–46.6)
MCH: 30.2 pg (ref 26.6–33.0)
MCHC: 33.4 g/dL (ref 31.5–35.7)
MCV: 90 fL (ref 79–97)
Platelets: 345 10*3/uL (ref 150–450)
RBC: 4.11 x10E6/uL (ref 3.77–5.28)
RDW: 12.6 % (ref 12.3–15.4)
WBC: 8.5 10*3/uL (ref 3.4–10.8)

## 2017-11-06 LAB — CMP14 + ANION GAP
ALBUMIN: 4.1 g/dL (ref 3.5–5.5)
ALT: 26 IU/L (ref 0–32)
AST: 21 IU/L (ref 0–40)
Albumin/Globulin Ratio: 1.2 (ref 1.2–2.2)
Alkaline Phosphatase: 98 IU/L (ref 39–117)
Anion Gap: 15 mmol/L (ref 10.0–18.0)
BILIRUBIN TOTAL: 0.3 mg/dL (ref 0.0–1.2)
BUN/Creatinine Ratio: 6 — ABNORMAL LOW (ref 9–23)
BUN: 5 mg/dL — AB (ref 6–24)
CALCIUM: 9 mg/dL (ref 8.7–10.2)
CHLORIDE: 101 mmol/L (ref 96–106)
CO2: 23 mmol/L (ref 20–29)
Creatinine, Ser: 0.86 mg/dL (ref 0.57–1.00)
GFR, EST AFRICAN AMERICAN: 98 mL/min/{1.73_m2} (ref >59)
GFR, EST NON AFRICAN AMERICAN: 85 mL/min/{1.73_m2} (ref >59)
GLUCOSE: 88 mg/dL (ref 65–99)
Globulin, Total: 3.4 g/dL (ref 1.5–4.5)
POTASSIUM: 3.5 mmol/L (ref 3.5–5.2)
Sodium: 139 mmol/L (ref 134–144)
TOTAL PROTEIN: 7.5 g/dL (ref 6.0–8.5)

## 2017-11-06 LAB — LIPID PANEL
CHOL/HDL RATIO: 3.2 ratio (ref 0.0–4.4)
Cholesterol, Total: 149 mg/dL (ref 100–199)
HDL: 47 mg/dL (ref >39)
LDL CALC: 87 mg/dL (ref 0–99)
Triglycerides: 73 mg/dL (ref 0–149)
VLDL CHOLESTEROL CAL: 15 mg/dL (ref 5–40)

## 2017-11-06 LAB — HEMOGLOBIN A1C
Est. average glucose Bld gHb Est-mCnc: 111 mg/dL
Hgb A1c MFr Bld: 5.5 % (ref 4.8–5.6)

## 2017-11-06 LAB — LIPASE: LIPASE: 41 U/L (ref 14–72)

## 2017-11-06 LAB — TSH: TSH: 1.92 u[IU]/mL (ref 0.450–4.500)

## 2017-11-07 LAB — CHLAMYDIA/GONOCOCCUS/TRICHOMONAS, NAA
Chlamydia by NAA: NEGATIVE
Gonococcus by NAA: NEGATIVE
Trich vag by NAA: NEGATIVE

## 2017-11-27 ENCOUNTER — Other Ambulatory Visit: Payer: Self-pay | Admitting: Nurse Practitioner

## 2017-11-27 DIAGNOSIS — F32A Depression, unspecified: Secondary | ICD-10-CM

## 2017-11-27 DIAGNOSIS — F329 Major depressive disorder, single episode, unspecified: Secondary | ICD-10-CM

## 2017-12-12 ENCOUNTER — Other Ambulatory Visit: Payer: Self-pay | Admitting: Nurse Practitioner

## 2017-12-12 DIAGNOSIS — F329 Major depressive disorder, single episode, unspecified: Secondary | ICD-10-CM

## 2017-12-12 DIAGNOSIS — F32A Depression, unspecified: Secondary | ICD-10-CM

## 2017-12-12 MED ORDER — BUPROPION HCL ER (SR) 150 MG PO TB12: 150.0000 mg | ORAL_TABLET | Freq: Every day | ORAL | 0 refills | Status: DC

## 2017-12-17 ENCOUNTER — Encounter: Payer: Self-pay | Admitting: Nurse Practitioner

## 2017-12-17 ENCOUNTER — Ambulatory Visit (INDEPENDENT_AMBULATORY_CARE_PROVIDER_SITE_OTHER): Payer: 59 | Admitting: Nurse Practitioner

## 2017-12-17 VITALS — BP 122/78 | HR 80 | Temp 98.4°F | Ht >= 80 in | Wt 292.4 lb

## 2017-12-17 DIAGNOSIS — F329 Major depressive disorder, single episode, unspecified: Secondary | ICD-10-CM

## 2017-12-17 DIAGNOSIS — R1031 Right lower quadrant pain: Secondary | ICD-10-CM

## 2017-12-17 DIAGNOSIS — F32A Depression, unspecified: Secondary | ICD-10-CM

## 2017-12-17 NOTE — Progress Notes (Signed)
  Subjective:     Patient ID: Rhonda Murray , female    DOB: 11-30-1975 , 42 y.o.   MRN: [id number]   Chief Complaint  Patient presents with  . Depression    patient presents today for a med check    HPI  Depression       The patient presents with depression.  This is a new problem.  The current episode started more than 1 month ago.   The onset quality is gradual.   Associated symptoms include no fatigue, no headaches, not sad and no suicidal ideas.     The symptoms are aggravated by family issues and work stress.  Past treatments include SSRIs - Selective serotonin reuptake inhibitors.  Compliance with treatment is good.  Previous treatment provided moderate relief.  Risk factors include stress.   Past medical history includes anxiety and depression.     Pertinent negatives include no hypothyroidism, no thyroid problem and no chronic illness.    Past Medical History:  Diagnosis Date  . Anxiety unknown  . Medical history non-contributory      Family History  Problem Relation Age of Onset  . Stroke Mother   . Hypertension Mother      Current Outpatient Medications:  .  buPROPion (WELLBUTRIN SR) 150 MG 12 hr tablet, Take 1 tablet (150 mg total) by mouth daily., Disp: 90 tablet, Rfl: 0   No Known Allergies   Review of Systems  Constitutional: Negative for fatigue.  Respiratory: Negative.   Cardiovascular: Negative.   Neurological: Negative for headaches.  Psychiatric/Behavioral: Positive for depression. Negative for agitation, behavioral problems, self-injury, sleep disturbance and suicidal ideas. The patient is nervous/anxious.      Today's Vitals   12/17/17 1521  BP: 122/78  Pulse: 80  Temp: 98.4 F (36.9 C)  TempSrc: Oral  SpO2: 98%  Weight: 292 lb 6.4 oz (132.6 kg)  Height: 6' 9.8" (2.078 m)  PainSc: 2   PainLoc: Ankle   Body mass index is 30.72 kg/m.   Objective:  Physical Exam  Constitutional: She is oriented to person, place, and time. She appears  well-developed and well-nourished.  Pulmonary/Chest: Effort normal. She exhibits no tenderness.  Neurological: She is alert and oriented to person, place, and time. No cranial nerve deficit.  Skin: Skin is warm and dry. Capillary refill takes less than 2 seconds.  Psychiatric: She has a normal mood and affect. Her behavior is normal. Judgment and thought content normal.      Assessment And Plan:      1. Depression, unspecified depression type  She is doing better with the wellbutrin  Depression score was 12 now 5  Continue current dose  Will refer for counseling   2. Right lower quadrant abdominal pain   Intermittent pain to right side approximately 1 week prior to when her cycle would come on  I suspect she may have a cyst to her ovary or fallopian.    No current pain  Advised to contact her [place], FNP

## 2017-12-17 NOTE — Patient Instructions (Signed)
Depression Screening Depression screening is a tool that your health care provider can use to learn if you have symptoms of depression. Depression is a common condition with many symptoms that are also often found in other conditions. Depression is treatable, but it must first be diagnosed. You may not know that certain feelings, thoughts, and behaviors that you are having can be symptoms of depression. Taking a depression screening test can help you and your health care provider decide if you need more assessment, or if you should be referred to a mental health care provider. What are the screening tests?  You may have a physical exam to see if another condition is affecting your mental health. You may have a blood or urine sample taken during the physical exam.  You may be interviewed using a screening tool that was developed from research, such as one of these: ? Patient Health Questionnaire (PHQ). This is a set of either 2 or 9 questions. A health care provider who has been trained to score this screening test uses a guide to assess if your symptoms suggest that you may have depression. ? Hamilton Depression Rating Scale (HAM-D). This is a set of either 17 or 24 questions. You may be asked to take it again during or after your treatment, to see if your depression has gotten better. ? Beck Depression Inventory (BDI). This is a set of 21 multiple choice questions. Your health care provider scores your answers to assess:  Your level of depression, ranging from mild to severe.  Your response to treatment.  Your health care provider may talk with you about your daily activities, such as eating, sleeping, work, and recreation, and ask if you have had any changes in activity.  Your health care provider may ask you to see a mental health specialist, such as a psychiatrist or psychologist, for more evaluation. Who should be screened for depression?  All adults, including adults with a family history  of a mental health disorder.  Adolescents who are 12-18 years old.  People who are recovering from a myocardial infarction (MI).  Pregnant women, or women who have given birth.  People who have a long-term (chronic) illness.  Anyone who has been diagnosed with another type of a mental health disorder.  Anyone who has symptoms that could show depression. What do my results mean? Your health care provider will review the results of your depression screening, physical exam, and lab tests. Positive screens suggest that you may have depression. Screening is the first step in getting the care that you may need. It is up to you to get your screening results. Ask your health care provider, or the department that is doing your screening tests, when your results will be ready. Talk with your health care provider about your results and diagnosis. A diagnosis of depression is made using the Diagnostic and Statistical Manual of Mental Disorders (DSM-V). This is a book that lists the number and type of symptoms that must be present for a health care provider to give a specific diagnosis.  Your health care provider may work with you to treat your symptoms of depression, or your health care provider may help you find a mental health provider who can assess, diagnose, and treat your depression. Get help right away if:  You have thoughts about hurting yourself or others. If you ever feel like you may hurt yourself or others, or have thoughts about taking your own life, get help right away. You can   go to your nearest emergency department or call:  Your local emergency services (911 in the [place]  A suicide crisis helpline, such as the National Suicide Prevention Lifeline at 1-800-273-8255. This is open 24 hours a day.  Summary  Depression screening is the first step in getting the help that you may need.  If your screening test shows symptoms of depression (is positive), your health care provider may ask  you to see a mental health provider.  Anyone who is age 12 or older should be screened for depression. This information is not intended to replace advice given to you by your health care provider. Make sure you discuss any questions you have with your health care provider. Document Released: 05/12/2016 Document Revised: 05/12/2016 Document Reviewed: 05/12/2016 Elsevier Interactive Patient Education  2018 Elsevier Inc.  

## 2017-12-27 DIAGNOSIS — Z30431 Encounter for routine checking of intrauterine contraceptive device: Secondary | ICD-10-CM | POA: Diagnosis not present

## 2017-12-27 DIAGNOSIS — R1031 Right lower quadrant pain: Secondary | ICD-10-CM | POA: Diagnosis not present

## 2018-03-11 ENCOUNTER — Encounter (HOSPITAL_COMMUNITY): Payer: Self-pay | Admitting: Emergency Medicine

## 2018-03-11 ENCOUNTER — Ambulatory Visit (HOSPITAL_COMMUNITY)
Admission: EM | Admit: 2018-03-11 | Discharge: 2018-03-11 | Disposition: A | Discharge status: Home / Self Care | Payer: 59 | Attending: Family Medicine | Admitting: Family Medicine

## 2018-03-11 DIAGNOSIS — R102 Pelvic and perineal pain: Secondary | ICD-10-CM

## 2018-03-11 LAB — POCT URINALYSIS DIP (DEVICE)
Bilirubin Urine: NEGATIVE
Glucose, UA: NEGATIVE mg/dL
HGB URINE DIPSTICK: NEGATIVE
Ketones, ur: NEGATIVE mg/dL
NITRITE: NEGATIVE
PH: 5.5 (ref 5.0–8.0)
Protein, ur: NEGATIVE mg/dL
SPECIFIC GRAVITY, URINE: 1.015 (ref 1.005–1.030)
UROBILINOGEN UA: 0.2 mg/dL (ref 0.0–1.0)

## 2018-03-11 MED ORDER — KETOROLAC TROMETHAMINE 60 MG/2ML IM SOLN
INTRAMUSCULAR | Status: AC
  Filled 2018-03-11: qty 2

## 2018-03-11 MED ORDER — NAPROXEN 500 MG PO TABS: 500.0000 mg | ORAL_TABLET | Freq: Two times a day (BID) | ORAL | 0 refills | Status: DC

## 2018-03-11 MED ORDER — KETOROLAC TROMETHAMINE 60 MG/2ML IM SOLN
60.0000 mg | Freq: Once | INTRAMUSCULAR | Status: AC
  Administered 2018-03-11: 60 mg via INTRAMUSCULAR

## 2018-03-11 NOTE — ED Triage Notes (Signed)
Pt sts abd pain on right side at c section incision line x 4 months

## 2018-03-11 NOTE — Discharge Instructions (Addendum)
I believe that your pain is most likely related to adhesions from the cesarean section We will give you a Toradol injection here today for pain You can take 500 mg of naproxen twice a day with food I will give you a contact to schedule, surgery to follow-up Some information about adhesions in your discharge directions

## 2018-03-13 DIAGNOSIS — Z719 Counseling, unspecified: Secondary | ICD-10-CM | POA: Diagnosis not present

## 2018-03-14 NOTE — ED Provider Notes (Signed)
[place]    CSN: [id number] Arrival date & time: 03/11/18  1033     History   Chief Complaint Chief Complaint  Patient presents with  . Abdominal Pain    HPI Rhonda Murray is a 43 y.o. female.   Pt is a 43 year old female that presents with pain in the area of her C section scar. The c section was 3 years ago.  This pain has been there for many months but has worsened over the last week. The pain has become more intense. She has been taking OTC pain relievers without much relief. She denies any associated fever, chill, N,V,D. No vaginal or urinary symptoms.  She has an IUD and has been evaluated for this same problem by her OB/GYN and primary care over the last few months. Reports her IUD is in place.  ROS per HPI      Past Medical History:  Diagnosis Date  . Anxiety unknown  . Medical history non-contributory     Patient Active Problem List   Diagnosis Date Noted  . S/P primary low transverse C-section 11/09/2014  . Hypokalemia 12/26/2012    Past Surgical History:  Procedure Laterality Date  . CESAREAN SECTION N/A 11/09/2014   Procedure: CESAREAN SECTION;  Surgeon: Paula Compton, MD;  Location: [facility];  Service: Obstetrics;  Laterality: N/A;  . LEEP      OB History    Gravida  3   Para  2   Term  1   Preterm  1   AB  1   Living  2     SAB  1   TAB      Ectopic      Multiple  0   Live Births  2            Home Medications    Prior to Admission medications   Medication Sig Start Date End Date Taking? Authorizing Provider  buPROPion (WELLBUTRIN SR) 150 MG 12 hr tablet Take 1 tablet (150 mg total) by mouth daily. 12/12/17   [name], FNP  naproxen (NAPROSYN) 500 MG tablet Take 1 tablet (500 mg total) by mouth 2 (two) times daily. 03/11/18   Orvan July, NP    Family History Family History  Problem Relation Age of Onset  . Stroke Mother   . Hypertension Mother     Social History Social History   Tobacco Use    . Smoking status: Former Research scientist (life sciences)  . Smokeless tobacco: Never Used  Substance Use Topics  . Alcohol use: No  . Drug use: No     Allergies   Patient has no known allergies.   Review of Systems Review of Systems   Physical Exam Triage Vital Signs ED Triage Vitals [03/11/18 1055]  Enc Vitals Group     BP (!) 150/92     Pulse Rate 94     Resp 18     Temp (!) 97.4 F (36.3 C)     Temp Source Temporal     SpO2 100 %     Weight      Height      Head Circumference      Peak Flow      Pain Score 6     Pain Loc      Pain Edu?      Excl. in [place]?    No data found.  Updated Vital Signs BP (!) 150/92 (BP Location: Right Arm)   Pulse 94  Temp (!) 97.4 F (36.3 C) (Temporal)   Resp 18   SpO2 100%   Visual Acuity Right Eye Distance:   Left Eye Distance:   Bilateral Distance:    Right Eye Near:   Left Eye Near:    Bilateral Near:     Physical Exam Vitals signs and nursing note reviewed.  Constitutional:      General: She is not in acute distress.    Appearance: She is well-developed. She is not ill-appearing, toxic-appearing or diaphoretic.  HENT:     Head: Normocephalic and atraumatic.  Pulmonary:     Effort: Pulmonary effort is normal.  Abdominal:     General: A surgical scar is present. There is no distension. There are no signs of injury.     Palpations: Abdomen is soft.     Tenderness: There is abdominal tenderness. There is no right CVA tenderness, left CVA tenderness, guarding or rebound. Negative signs include Murphy's sign.     Hernia: No hernia is present.     Comments: Tenderness to palpation on the right side of her C section scar. No palpable masses. No erythema.    Skin:    General: Skin is warm and dry.  Neurological:     Mental Status: She is alert.  Psychiatric:        Mood and Affect: Mood normal.      UC Treatments / Results  Labs (all labs ordered are listed, but only abnormal results are displayed) Labs Reviewed  POCT URINALYSIS  DIP (DEVICE) - Abnormal; Notable for the following components:      Result Value   Leukocytes,Ua TRACE (*)    All other components within normal limits    EKG None  Radiology No results found.  Procedures Procedures (including critical care time)  Medications Ordered in UC Medications  ketorolac (TORADOL) injection 60 mg (60 mg Intramuscular Given 03/11/18 1235)    Initial Impression / Assessment and Plan / UC Course  I have reviewed the triage vital signs and the nursing notes.  Pertinent labs & imaging results that were available during my care of the patient were reviewed by me and considered in my medical decision making (see chart for details).      Symptoms consistent with adhesions from abdominal surgery.  She will need further evaluation with a surgeon.  Instructed to contact her primary care doctor to get referral.  Urine was negative for infection here .  Pt understanding and agrees She can take aleve for the pain Toradol given here in the clinic for pain  Final Clinical Impressions(s) / UC Diagnoses   Final diagnoses:  Pelvic pain in female     Discharge Instructions     I believe that your pain is most likely related to adhesions from the cesarean section We will give you a Toradol injection here today for pain You can take 500 mg of naproxen twice a day with food I will give you a contact to schedule, surgery to follow-up Some information about adhesions in your discharge directions     ED Prescriptions    Medication Sig Dispense Auth. Provider   naproxen (NAPROSYN) 500 MG tablet Take 1 tablet (500 mg total) by mouth 2 (two) times daily. 30 tablet Orvan July, NP     Controlled Substance Prescriptions [place] Controlled Substance Registry consulted? no   Orvan July, NP 03/14/18 [phone]

## 2018-03-18 ENCOUNTER — Encounter: Payer: Self-pay | Admitting: Nurse Practitioner

## 2018-03-18 ENCOUNTER — Ambulatory Visit (INDEPENDENT_AMBULATORY_CARE_PROVIDER_SITE_OTHER): Payer: 59 | Admitting: Nurse Practitioner

## 2018-03-18 VITALS — BP 136/78 | HR 99 | Temp 98.3°F | Ht 67.0 in | Wt 295.4 lb

## 2018-03-18 DIAGNOSIS — R1031 Right lower quadrant pain: Secondary | ICD-10-CM | POA: Diagnosis not present

## 2018-03-18 DIAGNOSIS — Z98891 History of uterine scar from previous surgery: Secondary | ICD-10-CM

## 2018-03-18 DIAGNOSIS — F329 Major depressive disorder, single episode, unspecified: Secondary | ICD-10-CM | POA: Diagnosis not present

## 2018-03-18 DIAGNOSIS — R351 Nocturia: Secondary | ICD-10-CM | POA: Insufficient documentation

## 2018-03-18 DIAGNOSIS — R202 Paresthesia of skin: Secondary | ICD-10-CM | POA: Diagnosis not present

## 2018-03-18 DIAGNOSIS — F32A Depression, unspecified: Secondary | ICD-10-CM

## 2018-03-18 DIAGNOSIS — Z6841 Body Mass Index (BMI) 40.0 and over, adult: Secondary | ICD-10-CM

## 2018-03-18 HISTORY — DX: Paresthesia of skin: R20.2

## 2018-03-18 HISTORY — DX: Right lower quadrant pain: R10.31

## 2018-03-18 HISTORY — DX: Nocturia: R35.1

## 2018-03-18 NOTE — Progress Notes (Signed)
Subjective:     Patient ID: Rhonda Murray , female    DOB: August 25, 1975 , 43 y.o.   MRN: [id number]   Chief Complaint  Patient presents with  . med check    discuss depression     HPI  Some days are worse than others.  She can tell when she has forgotten to take the medication, becomes angry with everyone.    Obesity - she is drinking increasing sodas.    Depression         The patient presents with no depression.  This is a chronic problem.  The current episode started 1 to 4 weeks ago.   The onset quality is gradual.   The problem occurs constantly.  Associated symptoms include no decreased concentration, no fatigue, no decreased interest and no headaches.  Past treatments include SSRIs - Selective serotonin reuptake inhibitors.  Compliance with treatment is good.  Risk factors include stress.    Pertinent negatives include no chronic fatigue syndrome, no chronic pain, no hypothyroidism, no anxiety, no depression and no suicide attempts.    Past Medical History:  Diagnosis Date  . Anxiety unknown  . Medical history non-contributory      Family History  Problem Relation Age of Onset  . Stroke Mother   . Hypertension Mother      Current Outpatient Medications:  .  buPROPion (WELLBUTRIN SR) 150 MG 12 hr tablet, Take 1 tablet (150 mg total) by mouth daily., Disp: 90 tablet, Rfl: 0 .  naproxen (NAPROSYN) 500 MG tablet, Take 1 tablet (500 mg total) by mouth 2 (two) times daily., Disp: 30 tablet, Rfl: 0   No Known Allergies   Review of Systems  Constitutional: Negative for fatigue.  Respiratory: Negative for cough.   Cardiovascular: Negative for chest pain, palpitations and leg swelling.  Endocrine: Negative for polydipsia, polyphagia and polyuria.  Skin: Negative.   Neurological: Negative for dizziness and headaches.  Psychiatric/Behavioral: Positive for depression. Negative for decreased concentration.     Today's Vitals   03/18/18 1515  Weight: 295 lb 6.4 oz (134 kg)   Height: '5\' 7"'  (1.702 m)   Body mass index is 46.27 kg/m.   Objective:  Physical Exam Vitals signs reviewed.  Constitutional:      Appearance: Normal appearance.  Cardiovascular:     Rate and Rhythm: Normal rate and regular rhythm.     Pulses: Normal pulses.     Heart sounds: Normal heart sounds. No murmur.  Pulmonary:     Effort: Pulmonary effort is normal. No respiratory distress.     Breath sounds: Normal breath sounds.  Skin:    General: Skin is warm and dry.  Neurological:     General: No focal deficit present.     Mental Status: She is alert and oriented to person, place, and time.     Deep Tendon Reflexes: Reflexes normal (.diagm).  Psychiatric:        Mood and Affect: Mood normal.        Behavior: Behavior normal.        Thought Content: Thought content normal.        Judgment: Judgment normal.         Assessment And Plan:     1. Right lower quadrant abdominal pain  Tenderness with palpation, there is concern she has scar tissue which could be causing the abdominal pain - Ambulatory referral to General Surgery  2. History of cesarean section, unknown scar  Abdominal pain thought to be  related to scar tissue will refer to general surgery - Ambulatory referral to General Surgery  3. Nocturia  Frequent awakening at night time  She has fatigue during the day will refer for sleep study - Ambulatory referral to Sleep Studies  4. Class 3 severe obesity due to excess calories without serious comorbidity with body mass index (BMI) of 45.0 to 49.9 in adult [facility])  Chronic  Discussed healthy diet and regular exercise options   Encouraged to exercise at least 150 minutes per week with 2 days of strength training   5. Tingling of skin  Intermittent tingling to hands improves on its own, if worsens will consider further testing - CBC no Diff - BMP8+eGFR - Hemoglobin A1c - EKG 12-Lead  6. Depression, unspecified depression type  Stable with current dose  of wellbutrin  Advised to make sure she is taking the wellbutrin daily to avoid adverse effects    [name], FNP

## 2018-03-19 LAB — BMP8+EGFR
BUN/Creatinine Ratio: 13 (ref 9–23)
BUN: 13 mg/dL (ref 6–24)
CO2: 22 mmol/L (ref 20–29)
Calcium: 9.7 mg/dL (ref 8.7–10.2)
Chloride: 100 mmol/L (ref 96–106)
Creatinine, Ser: 1.02 mg/dL — ABNORMAL HIGH (ref 0.57–1.00)
GFR calc Af Amer: 80 mL/min/{1.73_m2} (ref >59)
GFR calc non Af Amer: 69 mL/min/{1.73_m2} (ref >59)
Glucose: 87 mg/dL (ref 65–99)
POTASSIUM: 4 mmol/L (ref 3.5–5.2)
Sodium: 137 mmol/L (ref 134–144)

## 2018-03-19 LAB — HEMOGLOBIN A1C
Est. average glucose Bld gHb Est-mCnc: 117 mg/dL
Hgb A1c MFr Bld: 5.7 % — ABNORMAL HIGH (ref 4.8–5.6)

## 2018-03-19 LAB — CBC
Hematocrit: 38.7 % (ref 34.0–46.6)
Hemoglobin: 13.3 g/dL (ref 11.1–15.9)
MCH: 31.2 pg (ref 26.6–33.0)
MCHC: 34.4 g/dL (ref 31.5–35.7)
MCV: 91 fL (ref 79–97)
Platelets: 342 10*3/uL (ref 150–450)
RBC: 4.26 x10E6/uL (ref 3.77–5.28)
RDW: 12.4 % (ref 11.7–15.4)
WBC: 9.8 10*3/uL (ref 3.4–10.8)

## 2018-04-02 ENCOUNTER — Other Ambulatory Visit: Payer: Self-pay | Admitting: Nurse Practitioner

## 2018-04-02 DIAGNOSIS — F329 Major depressive disorder, single episode, unspecified: Secondary | ICD-10-CM

## 2018-04-02 DIAGNOSIS — F32A Depression, unspecified: Secondary | ICD-10-CM

## 2018-04-07 ENCOUNTER — Encounter: Payer: Self-pay | Admitting: Nurse Practitioner

## 2018-04-07 DIAGNOSIS — F32A Depression, unspecified: Secondary | ICD-10-CM | POA: Insufficient documentation

## 2018-04-07 DIAGNOSIS — F329 Major depressive disorder, single episode, unspecified: Secondary | ICD-10-CM | POA: Insufficient documentation

## 2018-04-22 ENCOUNTER — Telehealth: Payer: Self-pay | Admitting: Neurology

## 2018-04-22 NOTE — Telephone Encounter (Signed)
Due to current COVID 19 pandemic, our office is severely reducing in office visits until further notice, in order to minimize the risk to our patients and healthcare providers.   Called patient to offer a virtual visit for her 4/29 appointment, which she accepted. Patient verbalized understanding of the steps involved in the connection process for the webex meeting. Patient understands she will receive an e-mail with the link necessary for connection as well as directions. I advised patient she will receive a call prior to her appointment to go over chart history. Patient agrees.  Pt understands that although there may be some limitations with this type of visit, we will take all precautions to reduce any security or privacy concerns.  Pt understands that this will be treated like an in office visit and we will file with pt's insurance, and there may be a patient responsible charge related to this service.  Pt's email is [email or web address]. Pt understands that the [employer] webex software must be downloaded and operational on the device pt plans to use for the visit.

## 2018-05-07 NOTE — Telephone Encounter (Signed)
I called pt again to discuss. No answer, left a message asking her to call me back. 

## 2018-05-07 NOTE — Telephone Encounter (Signed)
I called pt to update her chart prior to her appt. No answer, left a message asking her to call me back.

## 2018-05-07 NOTE — Telephone Encounter (Signed)
Pt returned my call. The call was dropped during the ESS and I have been unable to reach her again.  Pt's meds, allergies, and PMH were updated.  Pt has never had a sleep study but does endorse snoring.  Pt was instructed on how to measure her neck size prior to her appt.  Pt's weight is 295 lbs and she is 5'7.  I tried several times to get pt back on phone, phone rings straight to VM. Will try again tomorrow.

## 2018-05-08 ENCOUNTER — Ambulatory Visit (INDEPENDENT_AMBULATORY_CARE_PROVIDER_SITE_OTHER): Payer: 59 | Admitting: Neurology

## 2018-05-08 ENCOUNTER — Other Ambulatory Visit: Payer: Self-pay

## 2018-05-08 ENCOUNTER — Encounter: Payer: Self-pay | Admitting: Neurology

## 2018-05-08 DIAGNOSIS — Z6841 Body Mass Index (BMI) 40.0 and over, adult: Secondary | ICD-10-CM

## 2018-05-08 DIAGNOSIS — G479 Sleep disorder, unspecified: Secondary | ICD-10-CM | POA: Diagnosis not present

## 2018-05-08 DIAGNOSIS — R351 Nocturia: Secondary | ICD-10-CM

## 2018-05-08 DIAGNOSIS — R0683 Snoring: Secondary | ICD-10-CM

## 2018-05-08 DIAGNOSIS — R51 Headache: Secondary | ICD-10-CM

## 2018-05-08 DIAGNOSIS — R519 Headache, unspecified: Secondary | ICD-10-CM

## 2018-05-08 NOTE — Patient Instructions (Signed)
Given verbally, during today's virtual video-based encounter, with verbal feedback received.   

## 2018-05-08 NOTE — Telephone Encounter (Signed)
I called pt again.  Epworth Sleepiness Scale 0= would never doze 1= slight chance of dozing 2= moderate chance of dozing 3= high chance of dozing  Sitting and reading: 0 Watching TV: 1 Sitting inactive in a public place (ex. Theater or meeting): 0 As a passenger in a car for an hour without a break: 0 Lying down to rest in the afternoon: 1 Sitting and talking to someone: 0 Sitting quietly after lunch (no alcohol): 0 In a car, while stopped in traffic: 0 Total: 2  FSS: 36  Pt plans on using her laptop for her VV. I recommended converting her visit to [email or web address] since pt has not yet downloaded the webex app. Can use webex as back up. Emailed pt doxy.me link.  Webex info for back up: [email or web address] Meeting number (access code): [phone] Meeting password: [phone]

## 2018-05-08 NOTE — Progress Notes (Signed)
Star Age, MD, PhD [facility] [address] [place], [place] [address]   Virtual Visit via Video Note on 05/08/2018:  I connected with [name] on 05/08/18 at  1:30 PM EDT by a video enabled telemedicine application and verified that I am speaking with the correct person using two identifiers.   I discussed the limitations of evaluation and management by telemedicine and the availability of in person appointments. The patient expressed understanding and agreed to proceed.  History of Present Illness: [name] is a 43 year old right-handed woman with an underlying medical history of anxiety, depression, and morbid obesity with BMI of over 80, with whom I am conducting a virtual, video based new patient visit via Doxy.me in lieu of a face-to-face visit for evaluation of her sleep disorder, in particular, evaluation for underlying obstructive sleep apnea. The patient is unaccompanied today and joins via laptop from home. She is referred by her primary care nurse practitioner, Rhonda Murray, and I reviewed her note from 03/18/2018.   Her Epworth sleepiness score is 2 out of 24, fatigue severity score is 36 out of 63.  She reports difficulty initiating and maintaining sleep for the past year or longer. 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 [name] sleeps in her bed with her. She lives with her significant other who sleeps in a separate bedroom and she also has a 39 year old son. They have a cat in the house, not in her bedroom. She has a TV in her bedroom but turns it off at night. She is a nonsmoker and drinks caffeine in the form of soda, 2-3 cans per day on average. She drinks alcohol occasionally. She has a family history of sleep apnea in her mother who uses a CPAP machine. Patient has not  had a tonsillectomy. 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 Bedtime is currently around 8 or 8:30, rise time between 5:30 and 5:40 AM, she has nocturia about 2-3 times per average night and has had the occasional morning headache. She works from 6 AM to 2 PM currently. She gained weight particularly since her pregnancy and has had difficulty losing weight.    Her Past Medical History Is Significant For: Past Medical History:  Diagnosis Date   Anxiety unknown   Medical history non-contributory     Her Past Surgical History Is Significant For: Past Surgical History:  Procedure Laterality Date   CESAREAN SECTION N/A 11/09/2014   Procedure: CESAREAN SECTION;  Surgeon: Paula Compton, MD;  Location: [facility];  Service: Obstetrics;  Laterality: N/A;   LEEP      Her Family History Is Significant For: Family History  Problem Relation Age of Onset   Stroke Mother    Hypertension Mother     Her Social History Is Significant For: Social History   Socioeconomic History   Marital status: Married    Spouse name: Not on file   Number of children: Not on file   Years of education: Not on file   Highest education level: Not on file  Occupational History   Not on file  Social Needs   Financial resource strain: Not on file   Food insecurity:    Worry: Not on file    Inability: Not on file   Transportation needs:  Medical: Not on file    Non-medical: Not on file  Tobacco Use   Smoking status: Former Smoker   Smokeless tobacco: Never Used  Substance and Sexual Activity   Alcohol use: No   Drug use: No   Sexual activity: Not on file  Lifestyle   Physical activity:    Days per week: Not on file    Minutes per session: Not on file   Stress: Not on file  Relationships   Social connections:    Talks on phone: Not on file    Gets together: Not on file    Attends religious service: Not on file    Active member of club or organization: Not  on file    Attends meetings of clubs or organizations: Not on file    Relationship status: Not on file  Other Topics Concern   Not on file  Social History Narrative   Not on file    Her Allergies Are:  No Known Allergies:   Her Current Medications Are:  Outpatient Encounter Medications as of 05/08/2018  Medication Sig   buPROPion (WELLBUTRIN SR) 150 MG 12 hr tablet TAKE 1 TABLET BY MOUTH EVERY DAY   naproxen (NAPROSYN) 500 MG tablet Take 1 tablet (500 mg total) by mouth 2 (two) times daily.   No facility-administered encounter medications on file as of 05/08/2018.   :   Review of Systems:  Out of a complete 14 point review of systems, all are reviewed and negative with the exception of these symptoms as listed below:  Observations/Objective: Her most recent vital signs on file for my review are from 03/18/2018: Blood pressure 136/78 with pulse of 99, temperature 98.3, weight 295.4 pounds for BMI of 46.27.        Her neck circumference by self-report is 18.25 inches. She has not weighed herself at home lately. On examination, she is pleasant, conversant, in no acute distress. Face is symmetric with normal facial animation noted. Speech is clear without dysarthria, hypophonia or voice tremor noted. Airway examination reveals moderate airway crowding secondary to smaller airway entry, tongue does not protrude all the way, she may have a tighter frenulum. Palate elevates symmetrically, she has tonsils in the 2+ range, right side easier to see than left, Mallampati class II. She has a minimal overbite. She stands without difficulty and walks without problems, upper body movements fine, coordination in the upper extremities grossly unremarkable.  Assessment and Plan: [name] is a 43 year old right-handed woman with an underlying medical history of anxiety, depression, and morbid obesity with BMI of over 76, with whom I am conducting a virtual, video based new patient visit via  doxy.me in lieu of a face-to-face visit for evaluation of an underlying organic sleep disorder, in particular, concern for obstructive sleep apnea. The patient's medical history and physical exam (albeit limited with current video-based evaluation) are concerning for a diagnosis of obstructive sleep apnea. I discussed with the patient the diagnosis of OSA, its prognosis and treatment options. I explained in particular the risks and ramifications of untreated moderate to severe OSA, especially with respect to developing cardiovascular disease down the Road, including congestive heart failure, difficult to treat hypertension, cardiac arrhythmias, or stroke. Even type 2 diabetes has, in part, been linked to untreated OSA. Symptoms of untreated OSA may include daytime sleepiness, memory problems, mood irritability and mood disorder such as depression and anxiety, lack of energy, as well as recurrent headaches, especially morning headaches. We talked about the importance  of weight control. We talked about the importance of maintaining good sleep hygiene. I recommended the following at this time: home sleep test.  I explained the sleep test procedure to the patient and also outlined possible treatment options of OSA, including the use of a custom-made dental device (which would require a referral to a specialist dentist), upper airway surgical options, (such as UPPP, which would involve a referral to an ENT). I also explained the CPAP vs. AutoPAP treatment option to the patient, who indicated that she would be willing to try CPAP if the need arises. I answered all her questions today and the patient was in agreement. I plan to see the patient back after the sleep study is completed and encouraged her to call with any interim questions, concerns, problems or updates.   Star Age, MD, PhD    Follow Up Instructions: 1. HST, sleep lab staff will reach out to patient to arrange for either sending the unit to home  address or a "drive by pickup" and for tutorial, making sure patient is comfortable with the unit and usage, and return of equipment, if necessary.  2. Consider AutoPap therapy, if home sleep test positive for obstructive sleep apnea, patient agreeable. 3. We talked about alternative treatment options and current limitations, due to virus pandemic.  4. Follow-up after starting AutoPap therapy, follow-up to be scheduled according to set-up date, typically within 31 to 89 days post treatment start. 5. Pursue healthy lifestyle, good sleep hygiene, healthy weight. 6. Call for any interim questions or concerns.    I discussed the assessment and treatment plan with the patient. The patient was provided an opportunity to ask questions and all were answered. The patient agreed with the plan and demonstrated an understanding of the instructions.   The patient was advised to call back or seek an in-person evaluation if the symptoms worsen or if the condition fails to improve as anticipated.  I provided 30 minutes of non-face-to-face time during this encounter.   Star Age, MD

## 2018-06-17 ENCOUNTER — Ambulatory Visit (INDEPENDENT_AMBULATORY_CARE_PROVIDER_SITE_OTHER): Payer: 59 | Admitting: Neurology

## 2018-06-17 ENCOUNTER — Other Ambulatory Visit: Payer: Self-pay

## 2018-06-17 DIAGNOSIS — R351 Nocturia: Secondary | ICD-10-CM

## 2018-06-17 DIAGNOSIS — G4733 Obstructive sleep apnea (adult) (pediatric): Secondary | ICD-10-CM | POA: Diagnosis not present

## 2018-06-17 DIAGNOSIS — R519 Headache, unspecified: Secondary | ICD-10-CM

## 2018-06-17 DIAGNOSIS — R0683 Snoring: Secondary | ICD-10-CM

## 2018-06-17 DIAGNOSIS — G479 Sleep disorder, unspecified: Secondary | ICD-10-CM

## 2018-06-17 DIAGNOSIS — Z6841 Body Mass Index (BMI) 40.0 and over, adult: Secondary | ICD-10-CM

## 2018-06-19 ENCOUNTER — Ambulatory Visit: Payer: 59 | Admitting: Nurse Practitioner

## 2018-06-27 ENCOUNTER — Telehealth: Payer: Self-pay

## 2018-06-27 NOTE — Telephone Encounter (Signed)
-----   Message from Star Age, MD sent at 06/27/2018 12:11 PM EDT ----- Patient referred by [name], NP, seen virtually by me on 05/08/18, HST on 06/17/18.    Please call and notify the patient that the recent home sleep test showed obstructive sleep apnea in the moderate range. While I recommend treatment for this in the form CPAP, we are not yet bringing patients in for in-lab testing for CPAP titration studies, due to the virus pandemic; therefore, I suggest we start her on a trial of autoPAP at home, which means, that we don't have to bring her in for a sleep study with CPAP, but will let her start using an autoPAP machine at home, through a DME company (of patient's choice, or as per insurance requirement, as per in [employer], if there are such restrictions, depending on insurance carrier). The DME representative will educate the patient on how to use the machine, how to put the mask on, etc. I have placed an order in the chart. Please send referral, talk to patient, send report to referring MD. We will need a FU in sleep clinic for 10 weeks post-PAP set up, please arrange that with me or one of our NPs.  Also, please remind patient about the importance of compliance with PAP usage, this is an Designer, industrial/product, but good compliance also helps [place] track improvements in patient's sleep related complaints and objective improvements, such as BP and weight for example or nocturia or headaches, etc. For concerns and questions about how to clean the PAP machine and the supplies and how frequently to change the hose, mask and filters, etc., patient can call the DME company, for more information, education and troubleshooting. Especially in the current situation, I recommend, patients be extra mindful about hand hygiene, handling the PAP equipment only with clean hands, wipe the mask daily, keep little one and four-legged companions (and any other pets for that matter) away from the machine and mask at all  times.     Thanks,   Star Age, MD, PhD [facility])

## 2018-06-27 NOTE — Telephone Encounter (Signed)
I called Rhonda Murray. I advised Rhonda Murray that Dr. Rexene Alberts reviewed their sleep study results and found that Rhonda Murray has moderate osa. Dr. Rexene Alberts recommends that Rhonda Murray start an auto pap at home. I reviewed PAP compliance expectations with the Rhonda Murray. Rhonda Murray is agreeable to starting an auto-PAP. I advised Rhonda Murray that an order will be sent to a DME, [facility], and [facility] will call the Rhonda Murray within about one week after they file with the Rhonda Murray's insurance. [facility] will show the Rhonda Murray how to use the machine, fit for masks, and troubleshoot the auto-PAP if needed. A follow up appt was made for insurance purposes with Dr. Rexene Alberts on 08/26/18 at 3:00pm. Rhonda Murray verbalized understanding to arrive 15 minutes early and bring their auto-PAP. A letter with all of this information in it will be mailed to the Rhonda Murray as a reminder. I verified with the Rhonda Murray that the address we have on file is correct. Rhonda Murray verbalized understanding of results. Rhonda Murray had no questions at this time but was encouraged to call back if questions arise. I have sent the order to [facility] and have received confirmation that they have received the order.

## 2018-06-27 NOTE — Procedures (Signed)
Patient Information     First Name: Rhonda Last Name: Murray Cronk: [id number]  Birth Date: 07-May-1975 Age: 43 Gender: Female  Referring Provider: [name], FNP BMI: 46.4 (W=295 lb, H=5' 7'')  Neck Circ.:  18 '' Epworth:  2/24   Sleep Study Information    Study Date: Jun 17, 2018 S/H/A Version: 001.001.001.001 / 4.1.1528 / 36  History: 43 year old right-handed woman with an underlying medical history of anxiety, depression, and morbid obesity with BMI of over 45, who reports  difficulty initiating and maintaining sleep. She has a family history of OSA.   Summary & Diagnosis:    OSA  Recommendations:      This home sleep test demonstrates moderate obstructive sleep apnea with a total AHI of 15.5/hour and O2 nadir of 80%. Treatment with positive airway pressure (PAP) - in the form of CPAP - is recommended. This will require, ideally, a full night CPAP titration study for proper treatment settings, O2 monitoring and mask fitting. Based on the severity of the sleep disordered breathing, an attended titration study is indicated. However, under the current circumstances (i.e. the COVID-19 pandemic), in order to ensure continuity of care and for the safety of the patient and healthcare professionals, she will be advised to proceed with an autoPAP titration/trial at home. A proper overnight, lab-attended PAP titration study with CPAP may be helpful or needed down the road to optimize treatment, when considered safe. Please note, that untreated obstructive sleep apnea may carry additional perioperative morbidity. Patients with significant obstructive sleep apnea should receive perioperative PAP therapy and the surgeons and particularly the anesthesiologist should be informed of the diagnosis and the severity of the sleep disordered breathing. Patient will be reminded regarding compliance with the PAP machine and to be mindful of cleanliness with the equipment and timely with supply changes (i.e. changing filter,  mask, hose, humidifier chamber on an ongoing basis, as recommended, and cleaning parts that touch the face and nose daily, etc). The patient should be cautioned not to drive, work at heights, or operate dangerous or heavy equipment when tired or sleepy. Review and reiteration of good sleep hygiene measures should be pursued with any patient. Other causes of the patient's symptoms, including circadian rhythm disturbances, an underlying mood disorder, medication effect and/or an underlying medical problem cannot be ruled out based on this test. Clinical correlation is recommended. The patient and her referring provider will be notified of the test results. The patient will be seen in follow up in sleep clinic at [facility], either for a face-to-face or virtual visit, whichever feasible and recommended at the time.  I certify that I have reviewed the raw data recording prior to the issuance of this report in accordance with the standards of the American Academy of Sleep Medicine (AASM).  Star Age, MD, PhD [facility]) Diplomat, ABPN (Neurology and Sleep)              Start Study Time: End Study Time: Total Recording Time:  10:05:46 PM   5:57:57 AM   7 h, 52 min  Total Sleep Time % REM of Sleep Time:  6 h, 45 min  28.8    Mean: 96 Minimum: 80 Maximum: 100  Mean of Desaturations Nadirs (%):   91  Oxygen Desaturation %:   4-9 10-20 >20 Total  Events Number Total    38  11 77.6 22.4  0 0.0  49 100.0  Oxygen Saturation: <90 <=88 <85 <80 <70  Duration (  minutes): Sleep % 3.1 0.8  2.3 0.5  0.6 0.1 0.0 0.0 0.0 0.0     Respiratory Indices       Total Events REM NREM All Night  pRDI: pAHI: ODI:  126  99  49 40.2 33.9 16.7 12.0 8.6 4.3 19.7 15.5 7.7       Pulse Rate Statistics during Sleep (BPM)      Mean:  89 Minimum: 68  Maximum: 121    Sleep Summary  Oxygen Saturation Statistics   Indices are calculated using technically valid sleep time of   6 hrs, 23 min. pRDI/pAHI are calculated using oxi desaturations ? 3%                Sleep Stages Chart                        pAHI=15.5                                     Mild              Moderate                    Severe                                                    5              15                    30  * Reference values are according to AASM guidelines

## 2018-06-27 NOTE — Progress Notes (Signed)
a 

## 2018-06-27 NOTE — Progress Notes (Signed)
Patient referred by [name], NP, seen virtually by me on 05/08/18, HST on 06/17/18.    Please call and notify the patient that the recent home sleep test showed obstructive sleep apnea in the moderate range. While I recommend treatment for this in the form CPAP, we are not yet bringing patients in for in-lab testing for CPAP titration studies, due to the virus pandemic; therefore, I suggest we start her on a trial of autoPAP at home, which means, that we don't have to bring her in for a sleep study with CPAP, but will let her start using an autoPAP machine at home, through a DME company (of patient's choice, or as per insurance requirement, as per in [employer], if there are such restrictions, depending on insurance carrier). The DME representative will educate the patient on how to use the machine, how to put the mask on, etc. I have placed an order in the chart. Please send referral, talk to patient, send report to referring MD. We will need a FU in sleep clinic for 10 weeks post-PAP set up, please arrange that with me or one of our NPs.  Also, please remind patient about the importance of compliance with PAP usage, this is an Designer, industrial/product, but good compliance also helps [place] track improvements in patient's sleep related complaints and objective improvements, such as BP and weight for example or nocturia or headaches, etc. For concerns and questions about how to clean the PAP machine and the supplies and how frequently to change the hose, mask and filters, etc., patient can call the DME company, for more information, education and troubleshooting. Especially in the current situation, I recommend, patients be extra mindful about hand hygiene, handling the PAP equipment only with clean hands, wipe the mask daily, keep little one and four-legged companions (and any other pets for that matter) away from the machine and mask at all times.    Thanks,   Star Age, MD, PhD [facility])

## 2018-06-27 NOTE — Addendum Note (Signed)
Addended by: Star Age on: 06/27/2018 12:11 PM   Modules accepted: Orders

## 2018-06-29 ENCOUNTER — Other Ambulatory Visit: Payer: Self-pay | Admitting: Nurse Practitioner

## 2018-06-29 DIAGNOSIS — F329 Major depressive disorder, single episode, unspecified: Secondary | ICD-10-CM

## 2018-06-29 DIAGNOSIS — F32A Depression, unspecified: Secondary | ICD-10-CM

## 2018-07-02 ENCOUNTER — Other Ambulatory Visit: Payer: Self-pay | Admitting: Nurse Practitioner

## 2018-07-02 DIAGNOSIS — F329 Major depressive disorder, single episode, unspecified: Secondary | ICD-10-CM

## 2018-07-02 DIAGNOSIS — F32A Depression, unspecified: Secondary | ICD-10-CM

## 2018-07-02 MED ORDER — BUPROPION HCL ER (SR) 150 MG PO TB12: 150.0000 mg | ORAL_TABLET | Freq: Every day | ORAL | 0 refills | Status: DC

## 2018-08-26 ENCOUNTER — Ambulatory Visit: Payer: Self-pay | Admitting: Neurology

## 2018-09-23 ENCOUNTER — Ambulatory Visit: Payer: Self-pay | Admitting: Neurology

## 2018-09-24 ENCOUNTER — Encounter: Payer: Self-pay | Admitting: Neurology

## 2018-10-01 ENCOUNTER — Other Ambulatory Visit: Payer: Self-pay | Admitting: Nurse Practitioner

## 2018-10-01 DIAGNOSIS — F329 Major depressive disorder, single episode, unspecified: Secondary | ICD-10-CM

## 2018-10-01 DIAGNOSIS — F32A Depression, unspecified: Secondary | ICD-10-CM

## 2018-10-31 ENCOUNTER — Encounter: Payer: Self-pay | Admitting: Nurse Practitioner

## 2018-11-05 ENCOUNTER — Telehealth: Payer: Self-pay

## 2018-11-05 NOTE — Telephone Encounter (Signed)
Spoke with pt to let her know that referral coordinator spoke with [facility] and that they called the pt and left a vm informing pt that she had an appointment for April 3rd. Pt missed the appt. Pt was advised to call [facility]

## 2018-11-07 LAB — HM PAP SMEAR: HM Pap smear: NORMAL

## 2018-11-14 ENCOUNTER — Encounter: Payer: Self-pay | Admitting: Nurse Practitioner

## 2018-11-14 LAB — HM PAP SMEAR: HM Pap smear: NORMAL

## 2018-11-25 ENCOUNTER — Ambulatory Visit: Payer: Self-pay | Admitting: Surgery

## 2018-11-25 NOTE — H&P (Signed)
Rhonda Murray Documented: 11/25/2018 2:10 PM Location: [facility] Patient #: [id number] DOB: [birth year] Single / Language: [name] / Race: Black or African American Female  History of Present Illness Rhonda Moores A. [name] MD; 11/25/2018 3:53 PM) Patient words: Patient sent at work at the request of [name] fnp for right lower quadrant abdominal pain. The patient states she's had many months of pain along the right side of an old C-section scar. She states she can feel a bulge when she is standing up. It goes away when she lies supine. The pain is described as burning made worse with coughing, sneezing or lifting. Its intermittent and of moderate severity.  The patient is a 43 year old female.   Past Surgical History [name], [name]; 11/25/2018 2:10 PM) No pertinent past surgical history  Diagnostic Studies History [name], [place]; 11/25/2018 2:10 PM) Colonoscopy never Mammogram within last year Pap Smear 1-5 years ago  Allergies [name], [name]; 11/25/2018 2:11 PM) No Known Drug Allergies [11/25/2018]: Allergies Reconciled  Medication History [name], [name]; 11/25/2018 2:11 PM) buPROPion HCl ER (SR) (150MG  Tablet ER 12HR, Oral) Active. Naproxen (500MG  Tablet, Oral) Active. Medications Reconciled  Social History [name], [name]; 11/25/2018 2:10 PM) Alcohol use Occasional alcohol use. Caffeine use Carbonated beverages. No drug use Tobacco use Former smoker.  Family History [name], [name]; 11/25/2018 2:10 PM) Hypertension Mother.  Pregnancy / Birth History [name], [name]; 11/25/2018 2:10 PM) Age at menarche 33 years. Contraceptive History Intrauterine device, Oral contraceptives. Gravida 2 Irregular periods Length (months) of breastfeeding 3-6 Maternal age 33-30 Para 2  Other Problems [name], [name]; 11/25/2018 2:10 PM) Depression     Review of Systems [name] [name]; 11/25/2018 2:10 PM) General Not  Present- Appetite Loss.  Vitals [name] [name]; 11/25/2018 2:12 PM) 11/25/2018 2:11 PM Weight: 312 lb Height: 67in Body Surface Area: 2.44 m Body Mass Index: 48.87 kg/m  Temp.: 98.63F  Pulse: 102 (Regular)  BP: 160/90 (Sitting, Left Arm, Standard)        Physical Exam (Rhonda Barret A. Peyton Rossner MD; 11/25/2018 3:54 PM)  General Mental Status-Alert. General Appearance-Consistent with stated age. Hydration-Well hydrated. Voice-Normal.  Chest and Lung Exam Note: WOB normal no wheezing  Cardiovascular Note: NSR  Abdomen Note: c section scar noted bulge at right portin of scar 3 cm in size soft reducible no rebound or guarding  Neurologic Neurologic evaluation reveals -alert and oriented x 3 with no impairment of recent or remote memory. Mental Status-Normal.    Assessment & Plan (Rhonda [name] A. Creed Kail MD; 11/25/2018 3:55 PM)  ABDOMINAL PAIN (R10.9) Impression: CT scan to evaluate for a hernia at incision Discussed surgical repair with mesh if needed. More recommendations to follow after imaging complete  Current Plans Pt Education - [employer] Free Text Education/Instructions: discussed with patient and provided information. Pt Education - [employer] Free Text Education/Instructions: discussed with patient and provided information.  REDUCIBLE BULGE OF ABDOMINAL WALL (R19.00)

## 2018-11-28 ENCOUNTER — Other Ambulatory Visit: Payer: Self-pay | Admitting: Surgery

## 2018-11-28 DIAGNOSIS — R1900 Intra-abdominal and pelvic swelling, mass and lump, unspecified site: Secondary | ICD-10-CM

## 2018-12-20 ENCOUNTER — Ambulatory Visit
Admission: RE | Admit: 2018-12-20 | Discharge: 2018-12-20 | Disposition: A | Discharge status: Home / Self Care | Payer: 59 | Source: Ambulatory Visit | Attending: Surgery | Admitting: Surgery

## 2018-12-20 ENCOUNTER — Other Ambulatory Visit: Payer: Self-pay

## 2018-12-20 DIAGNOSIS — R1900 Intra-abdominal and pelvic swelling, mass and lump, unspecified site: Secondary | ICD-10-CM

## 2018-12-20 MED ORDER — IOPAMIDOL (ISOVUE-300) INJECTION 61%
125.0000 mL | Freq: Once | INTRAVENOUS | Status: AC | PRN
  Administered 2018-12-20: 125 mL via INTRAVENOUS

## 2019-02-12 ENCOUNTER — Other Ambulatory Visit: Payer: Self-pay | Admitting: Nurse Practitioner

## 2019-02-12 DIAGNOSIS — F32A Depression, unspecified: Secondary | ICD-10-CM

## 2019-02-12 DIAGNOSIS — F329 Major depressive disorder, single episode, unspecified: Secondary | ICD-10-CM

## 2019-02-12 MED ORDER — BUPROPION HCL ER (SR) 150 MG PO TB12: 150.0000 mg | ORAL_TABLET | Freq: Every day | ORAL | 0 refills | Status: DC

## 2019-03-10 ENCOUNTER — Other Ambulatory Visit: Payer: Self-pay | Admitting: Nurse Practitioner

## 2019-03-10 DIAGNOSIS — F329 Major depressive disorder, single episode, unspecified: Secondary | ICD-10-CM

## 2019-03-10 DIAGNOSIS — F32A Depression, unspecified: Secondary | ICD-10-CM

## 2019-03-31 ENCOUNTER — Ambulatory Visit: Payer: Self-pay | Admitting: Surgery

## 2019-03-31 NOTE — H&P (View-Only) (Signed)
Rhonda Murray Documented: 11/25/2018 2:10 PM Location: [facility] Patient #: [id number] DOB: 05-12-1975 Single / Language: Rhonda Murray / Race: Black or African American Female   History of Present Illness Rhonda Murray A. Rhonda Horn MD; 11/25/2018 3:53 PM) Patient words: Patient sent at work at the request of [name] fnp for right lower quadrant abdominal pain. The patient states she's had many months of pain along the right side of an old C-section scar. She states she can feel a bulge when she is standing up. It goes away when she lies supine. The pain is described as burning made worse with coughing, sneezing or lifting. Its intermittent and of moderate severity.  The patient is a 44 year old female.   Past Surgical History Rhonda Murray, [place]; 11/25/2018 2:10 PM) No pertinent past surgical history   Diagnostic Studies History Rhonda Murray, [place]; 11/25/2018 2:10 PM) Colonoscopy  never Mammogram  within last year Pap Smear  1-5 years ago  Allergies Rhonda Murray, [place]; 11/25/2018 2:11 PM) No Known Drug Allergies [11/25/2018]: Allergies Reconciled   Medication History Rhonda Murray, [place]; 11/25/2018 2:11 PM) buPROPion HCl ER (SR) (150MG  Tablet ER 12HR, Oral) Active. Naproxen (500MG  Tablet, Oral) Active. Medications Reconciled  Social History Rhonda Murray, [place]; 11/25/2018 2:10 PM) Alcohol use  Occasional alcohol use. Caffeine use  Carbonated beverages. No drug use  Tobacco use  Former smoker.  Family History Rhonda Murray, [name]; 11/25/2018 2:10 PM) Hypertension  Mother.  Pregnancy / Birth History Rhonda Murray, [place]; 11/25/2018 2:10 PM) Age at menarche  76 years. Contraceptive History  Intrauterine device, Oral contraceptives. Gravida  2 Irregular periods  Length (months) of breastfeeding  3-6 Maternal age  44-30 Para  2  Other Problems Rhonda Murray, [place]; 11/25/2018 2:10 PM) Depression     Review of Systems Rhonda Murray [name]; 11/25/2018 2:10  PM) General Not Present- Appetite Loss.  Vitals Rhonda Murray [name]; 11/25/2018 2:12 PM) 11/25/2018 2:11 PM Weight: 312 lb Height: 67in Body Surface Area: 2.44 m Body Mass Index: 48.87 kg/m  Temp.: 98.56F  Pulse: 102 (Regular)  BP: 160/90 (Sitting, Left Arm, Standard)       Physical Exam (Draper Gallon A. Saranne Crislip MD; 11/25/2018 3:54 PM) General Mental Status-Alert. General Appearance-Consistent with stated age. Hydration-Well hydrated. Voice-Normal.  Chest and Lung Exam Note: WOB normal no wheezing   Cardiovascular Note: NSR   Abdomen Note: c section scar noted bulge at right portin of scar 3 cm in size soft reducible no rebound or guarding   Neurologic Neurologic evaluation reveals -alert and oriented x 3 with no impairment of recent or remote memory. Mental Status-Normal.    Assessment & Plan (Tamla Winkels A. Annabel Gibeau MD; 11/25/2018 3:55 PM) ABDOMINAL PAIN (R10.9) Impression: CT scan to evaluate for a hernia at incision Discussed surgical repair with mesh if needed. More recommendations to follow after imaging complete Current Plans Pt Education - [facility] Free Text Education/Instructions: discussed with patient and provided information. Pt Education - [facility] Free Text Education/Instructions: discussed with patient and provided information. REDUCIBLE BULGE OF ABDOMINAL WALL (R19.00)  Mass abdominal wall- CT to evaluate  Recommend excision if endometroma due to pain      The procedure has been discussed with the patient.  Alternative therapies have been discussed with the patient.  Operative risks include bleeding,  Infection,  Organ injury,  Nerve injury,  Blood vessel injury,  DVT,  Pulmonary embolism,  Death,  And possible reoperation.  Medical management risks include worsening of present situation.  The success of the procedure is  50 -90 % at treating patients symptoms.  The patient understands and agrees to proceed.

## 2019-03-31 NOTE — H&P (Signed)
Rhonda Murray Documented: 11/25/2018 2:10 PM Location: [facility] Patient #: [id number] DOB: 1975/05/09 Single / Language: [name] / Race: Black or African American Female   History of Present Illness [name] A. Jeannetta Cerutti MD; 11/25/2018 3:53 PM) Patient words: Patient sent at work at the request of [name] fnp for right lower quadrant abdominal pain. The patient states she's had many months of pain along the right side of an old C-section scar. She states she can feel a bulge when she is standing up. It goes away when she lies supine. The pain is described as burning made worse with coughing, sneezing or lifting. Its intermittent and of moderate severity.  The patient is a 44 year old female.   Past Surgical History [name], [place]; 11/25/2018 2:10 PM) No pertinent past surgical history   Diagnostic Studies History [name], [place]; 11/25/2018 2:10 PM) Colonoscopy  never Mammogram  within last year Pap Smear  1-5 years ago  Allergies [name], [place]; 11/25/2018 2:11 PM) No Known Drug Allergies [11/25/2018]: Allergies Reconciled   Medication History [name], [place]; 11/25/2018 2:11 PM) buPROPion HCl ER (SR) (150MG  Tablet ER 12HR, Oral) Active. Naproxen (500MG  Tablet, Oral) Active. Medications Reconciled  Social History [name], [place]; 11/25/2018 2:10 PM) Alcohol use  Occasional alcohol use. Caffeine use  Carbonated beverages. No drug use  Tobacco use  Former smoker.  Family History [name], [name]; 11/25/2018 2:10 PM) Hypertension  Mother.  Pregnancy / Birth History [name], [place]; 11/25/2018 2:10 PM) Age at menarche  43 years. Contraceptive History  Intrauterine device, Oral contraceptives. Gravida  2 Irregular periods  Length (months) of breastfeeding  3-6 Maternal age  38-30 Para  2  Other Problems [name], [place]; 11/25/2018 2:10 PM) Depression     Review of Systems [name] [name]; 11/25/2018 2:10  PM) General Not Present- Appetite Loss.  Vitals [name] [name]; 11/25/2018 2:12 PM) 11/25/2018 2:11 PM Weight: 312 lb Height: 67in Body Surface Area: 2.44 m Body Mass Index: 48.87 kg/m  Temp.: 98.65F  Pulse: 102 (Regular)  BP: 160/90 (Sitting, Left Arm, Standard)       Physical Exam (Irma Roulhac A. Damarius Karnes MD; 11/25/2018 3:54 PM) General Mental Status-Alert. General Appearance-Consistent with stated age. Hydration-Well hydrated. Voice-Normal.  Chest and Lung Exam Note: WOB normal no wheezing   Cardiovascular Note: NSR   Abdomen Note: c section scar noted bulge at right portin of scar 3 cm in size soft reducible no rebound or guarding   Neurologic Neurologic evaluation reveals -alert and oriented x 3 with no impairment of recent or remote memory. Mental Status-Normal.    Assessment & Plan (Alexandre Faries A. Amreen Raczkowski MD; 11/25/2018 3:55 PM) ABDOMINAL PAIN (R10.9) Impression: CT scan to evaluate for a hernia at incision Discussed surgical repair with mesh if needed. More recommendations to follow after imaging complete Current Plans Pt Education - [facility] Free Text Education/Instructions: discussed with patient and provided information. Pt Education - [facility] Free Text Education/Instructions: discussed with patient and provided information. REDUCIBLE BULGE OF ABDOMINAL WALL (R19.00)  Mass abdominal wall- CT to evaluate  Recommend excision if endometroma due to pain      The procedure has been discussed with the patient.  Alternative therapies have been discussed with the patient.  Operative risks include bleeding,  Infection,  Organ injury,  Nerve injury,  Blood vessel injury,  DVT,  Pulmonary embolism,  Death,  And possible reoperation.  Medical management risks include worsening of present situation.  The success of the procedure is  50 -90 % at treating patients symptoms.  The patient understands and agrees to proceed.

## 2019-04-16 ENCOUNTER — Encounter (HOSPITAL_BASED_OUTPATIENT_CLINIC_OR_DEPARTMENT_OTHER): Payer: Self-pay | Admitting: Surgery

## 2019-04-16 ENCOUNTER — Other Ambulatory Visit: Payer: Self-pay

## 2019-04-18 ENCOUNTER — Other Ambulatory Visit: Payer: Self-pay | Admitting: Nurse Practitioner

## 2019-04-18 DIAGNOSIS — F32A Depression, unspecified: Secondary | ICD-10-CM

## 2019-04-18 DIAGNOSIS — F329 Major depressive disorder, single episode, unspecified: Secondary | ICD-10-CM

## 2019-04-21 ENCOUNTER — Encounter (HOSPITAL_BASED_OUTPATIENT_CLINIC_OR_DEPARTMENT_OTHER)
Admission: RE | Admit: 2019-04-21 | Discharge: 2019-04-21 | Disposition: A | Discharge status: Home / Self Care | Payer: 59 | Source: Ambulatory Visit | Attending: Surgery | Admitting: Surgery

## 2019-04-21 ENCOUNTER — Other Ambulatory Visit (HOSPITAL_COMMUNITY)
Admission: RE | Admit: 2019-04-21 | Discharge: 2019-04-21 | Disposition: A | Discharge status: Home / Self Care | Payer: 59 | Source: Ambulatory Visit | Attending: Surgery | Admitting: Surgery

## 2019-04-21 DIAGNOSIS — Z20822 Contact with and (suspected) exposure to covid-19: Secondary | ICD-10-CM | POA: Insufficient documentation

## 2019-04-21 DIAGNOSIS — Z791 Long term (current) use of non-steroidal anti-inflammatories (NSAID): Secondary | ICD-10-CM | POA: Diagnosis not present

## 2019-04-21 DIAGNOSIS — Z87891 Personal history of nicotine dependence: Secondary | ICD-10-CM | POA: Diagnosis not present

## 2019-04-21 DIAGNOSIS — Z01812 Encounter for preprocedural laboratory examination: Secondary | ICD-10-CM | POA: Diagnosis present

## 2019-04-21 DIAGNOSIS — R1900 Intra-abdominal and pelvic swelling, mass and lump, unspecified site: Secondary | ICD-10-CM | POA: Diagnosis present

## 2019-04-21 DIAGNOSIS — Z79899 Other long term (current) drug therapy: Secondary | ICD-10-CM | POA: Diagnosis not present

## 2019-04-21 DIAGNOSIS — G4730 Sleep apnea, unspecified: Secondary | ICD-10-CM | POA: Diagnosis not present

## 2019-04-21 DIAGNOSIS — N808 Other endometriosis: Secondary | ICD-10-CM | POA: Diagnosis not present

## 2019-04-21 DIAGNOSIS — Z9119 Patient's noncompliance with other medical treatment and regimen: Secondary | ICD-10-CM | POA: Diagnosis not present

## 2019-04-21 LAB — CBC WITH DIFFERENTIAL/PLATELET
Abs Immature Granulocytes: 0.02 10*3/uL (ref 0.00–0.07)
Basophils Absolute: 0 10*3/uL (ref 0.0–0.1)
Basophils Relative: 0 %
Eosinophils Absolute: 0.1 10*3/uL (ref 0.0–0.5)
Eosinophils Relative: 1 %
HCT: 39.5 % (ref 36.0–46.0)
Hemoglobin: 13.2 g/dL (ref 12.0–15.0)
Immature Granulocytes: 0 %
Lymphocytes Relative: 25 %
Lymphs Abs: 2.3 10*3/uL (ref 0.7–4.0)
MCH: 30.8 pg (ref 26.0–34.0)
MCHC: 33.4 g/dL (ref 30.0–36.0)
MCV: 92.1 fL (ref 80.0–100.0)
Monocytes Absolute: 0.5 10*3/uL (ref 0.1–1.0)
Monocytes Relative: 5 %
Neutro Abs: 6.3 10*3/uL (ref 1.7–7.7)
Neutrophils Relative %: 69 %
Platelets: 328 10*3/uL (ref 150–400)
RBC: 4.29 MIL/uL (ref 3.87–5.11)
RDW: 13.2 % (ref 11.5–15.5)
WBC: 9.2 10*3/uL (ref 4.0–10.5)
nRBC: 0 % (ref 0.0–0.2)

## 2019-04-21 LAB — COMPREHENSIVE METABOLIC PANEL
ALT: 26 U/L (ref 0–44)
AST: 21 U/L (ref 15–41)
Albumin: 3.7 g/dL (ref 3.5–5.0)
Alkaline Phosphatase: 80 U/L (ref 38–126)
Anion gap: 11 (ref 5–15)
BUN: 8 mg/dL (ref 6–20)
CO2: 22 mmol/L (ref 22–32)
Calcium: 9.1 mg/dL (ref 8.9–10.3)
Chloride: 103 mmol/L (ref 98–111)
Creatinine, Ser: 0.93 mg/dL (ref 0.44–1.00)
GFR calc Af Amer: >60 mL/min (ref >60)
GFR calc non Af Amer: >60 mL/min (ref >60)
Glucose, Bld: 124 mg/dL — ABNORMAL HIGH (ref 70–99)
Potassium: 3.3 mmol/L — ABNORMAL LOW (ref 3.5–5.1)
Sodium: 136 mmol/L (ref 135–145)
Total Bilirubin: 0.5 mg/dL (ref 0.3–1.2)
Total Protein: 7.6 g/dL (ref 6.5–8.1)

## 2019-04-21 LAB — SARS CORONAVIRUS 2 (TAT 6-24 HRS): SARS Coronavirus 2: NEGATIVE

## 2019-04-21 LAB — POCT PREGNANCY, URINE: Preg Test, Ur: NEGATIVE

## 2019-04-21 NOTE — Anesthesia Preprocedure Evaluation (Addendum)
Anesthesia Evaluation  Patient identified by MRN, date of birth, ID band  Reviewed: Allergy & Precautions, NPO status , Patient's Chart, lab work & pertinent test results  Airway Mallampati: III  TM Distance: >3 FB Neck ROM: Full    Dental no notable dental hx. (+) Teeth Intact, Dental Advisory Given   Pulmonary sleep apnea and Continuous Positive Airway Pressure Ventilation , former smoker,  Last April sleep study- noncompliant with CPAP   Pulmonary exam normal breath sounds clear to auscultation       Cardiovascular negative cardio ROS Normal cardiovascular exam Rhythm:Regular Rate:Normal     Neuro/Psych negative neurological ROS  negative psych ROS   GI/Hepatic Neg liver ROS,   Endo/Other  Morbid obesityBMI 49  Renal/GU negative Renal ROS  negative genitourinary   Musculoskeletal negative musculoskeletal ROS (+)   Abdominal (+) + obese,   Peds  Hematology  (+) anemia ,   Anesthesia Other Findings   Reproductive/Obstetrics (+) Pregnancy Breech presentation 36 weeks SROM                             Anesthesia Physical Anesthesia Plan  ASA: II  Anesthesia Plan: General   Post-op Pain Management:    Induction: Intravenous  PONV Risk Score and Plan:   Airway Management Planned: LMA  Additional Equipment:   Intra-op Plan:   Post-operative Plan:   Informed Consent: I have reviewed the patients History and Physical, chart, labs and discussed the procedure including the risks, benefits and alternatives for the proposed anesthesia with the patient or authorized representative who has indicated his/her understanding and acceptance.     Dental advisory given  Plan Discussed with: Anesthesiologist and CRNA  Anesthesia Plan Comments: (PAT visit for BMI, normal airway)       Anesthesia Quick Evaluation

## 2019-04-24 ENCOUNTER — Ambulatory Visit (HOSPITAL_BASED_OUTPATIENT_CLINIC_OR_DEPARTMENT_OTHER): Payer: 59 | Admitting: Anesthesiology

## 2019-04-24 ENCOUNTER — Encounter (HOSPITAL_BASED_OUTPATIENT_CLINIC_OR_DEPARTMENT_OTHER)
Admission: RE | Disposition: A | Discharge status: Home / Self Care | Payer: Self-pay | Source: Home / Self Care | Attending: Surgery

## 2019-04-24 ENCOUNTER — Ambulatory Visit (HOSPITAL_BASED_OUTPATIENT_CLINIC_OR_DEPARTMENT_OTHER)
Admission: RE | Admit: 2019-04-24 | Discharge: 2019-04-24 | Disposition: A | Discharge status: Home / Self Care | Payer: 59 | Attending: Surgery | Admitting: Surgery

## 2019-04-24 ENCOUNTER — Encounter (HOSPITAL_BASED_OUTPATIENT_CLINIC_OR_DEPARTMENT_OTHER): Payer: Self-pay | Admitting: Surgery

## 2019-04-24 ENCOUNTER — Other Ambulatory Visit: Payer: Self-pay | Admitting: Nurse Practitioner

## 2019-04-24 ENCOUNTER — Encounter: Payer: Self-pay | Admitting: Nurse Practitioner

## 2019-04-24 ENCOUNTER — Other Ambulatory Visit: Payer: Self-pay

## 2019-04-24 DIAGNOSIS — Z87891 Personal history of nicotine dependence: Secondary | ICD-10-CM | POA: Insufficient documentation

## 2019-04-24 DIAGNOSIS — Z791 Long term (current) use of non-steroidal anti-inflammatories (NSAID): Secondary | ICD-10-CM | POA: Insufficient documentation

## 2019-04-24 DIAGNOSIS — F329 Major depressive disorder, single episode, unspecified: Secondary | ICD-10-CM

## 2019-04-24 DIAGNOSIS — Z9119 Patient's noncompliance with other medical treatment and regimen: Secondary | ICD-10-CM | POA: Insufficient documentation

## 2019-04-24 DIAGNOSIS — N808 Other endometriosis: Secondary | ICD-10-CM | POA: Diagnosis not present

## 2019-04-24 DIAGNOSIS — Z79899 Other long term (current) drug therapy: Secondary | ICD-10-CM | POA: Insufficient documentation

## 2019-04-24 DIAGNOSIS — F32A Depression, unspecified: Secondary | ICD-10-CM

## 2019-04-24 DIAGNOSIS — G4730 Sleep apnea, unspecified: Secondary | ICD-10-CM | POA: Insufficient documentation

## 2019-04-24 HISTORY — PX: MASS EXCISION: SHX2000

## 2019-04-24 HISTORY — DX: Sleep apnea, unspecified: G47.30

## 2019-04-24 SURGERY — EXCISION MASS
Anesthesia: General | Site: Abdomen

## 2019-04-24 MED ORDER — ONDANSETRON HCL 4 MG/2ML IJ SOLN
INTRAMUSCULAR | Status: AC
  Filled 2019-04-24: qty 2

## 2019-04-24 MED ORDER — FENTANYL CITRATE (PF) 100 MCG/2ML IJ SOLN: 50.0000 ug | INTRAMUSCULAR | Status: DC | PRN

## 2019-04-24 MED ORDER — FENTANYL CITRATE (PF) 100 MCG/2ML IJ SOLN
INTRAMUSCULAR | Status: AC
  Filled 2019-04-24: qty 2

## 2019-04-24 MED ORDER — PROPOFOL 10 MG/ML IV BOLUS
INTRAVENOUS | Status: AC
  Filled 2019-04-24: qty 20

## 2019-04-24 MED ORDER — CEFAZOLIN SODIUM-DEXTROSE 2-4 GM/100ML-% IV SOLN
INTRAVENOUS | Status: AC
  Filled 2019-04-24: qty 100

## 2019-04-24 MED ORDER — LIDOCAINE 2% (20 MG/ML) 5 ML SYRINGE
INTRAMUSCULAR | Status: AC
  Filled 2019-04-24: qty 5

## 2019-04-24 MED ORDER — 0.9 % SODIUM CHLORIDE (POUR BTL) OPTIME
TOPICAL | Status: DC | PRN
  Administered 2019-04-24: 12:00:00 200 mL

## 2019-04-24 MED ORDER — MIDAZOLAM HCL 2 MG/2ML IJ SOLN
INTRAMUSCULAR | Status: AC
  Filled 2019-04-24: qty 2

## 2019-04-24 MED ORDER — BUPIVACAINE HCL (PF) 0.25 % IJ SOLN
INTRAMUSCULAR | Status: DC | PRN
  Administered 2019-04-24: 20 mL

## 2019-04-24 MED ORDER — PROPOFOL 10 MG/ML IV BOLUS
INTRAVENOUS | Status: DC | PRN
  Administered 2019-04-24: 200 mg via INTRAVENOUS

## 2019-04-24 MED ORDER — CELECOXIB 200 MG PO CAPS
ORAL_CAPSULE | ORAL | Status: AC
  Filled 2019-04-24: qty 1

## 2019-04-24 MED ORDER — ACETAMINOPHEN 500 MG PO TABS
1000.0000 mg | ORAL_TABLET | ORAL | Status: AC
  Administered 2019-04-24: 1000 mg via ORAL

## 2019-04-24 MED ORDER — LACTATED RINGERS IV SOLN: INTRAVENOUS | Status: DC

## 2019-04-24 MED ORDER — MIDAZOLAM HCL 2 MG/2ML IJ SOLN: 1.0000 mg | INTRAMUSCULAR | Status: DC | PRN

## 2019-04-24 MED ORDER — ONDANSETRON HCL 4 MG/2ML IJ SOLN
INTRAMUSCULAR | Status: DC | PRN
  Administered 2019-04-24: 4 mg via INTRAVENOUS

## 2019-04-24 MED ORDER — DEXAMETHASONE SODIUM PHOSPHATE 10 MG/ML IJ SOLN
INTRAMUSCULAR | Status: DC | PRN
  Administered 2019-04-24: 5 mg via INTRAVENOUS

## 2019-04-24 MED ORDER — DEXTROSE 5 % IV SOLN
3.0000 g | INTRAVENOUS | Status: AC
  Administered 2019-04-24: 2 g via INTRAVENOUS

## 2019-04-24 MED ORDER — FENTANYL CITRATE (PF) 250 MCG/5ML IJ SOLN
INTRAMUSCULAR | Status: DC | PRN
  Administered 2019-04-24 (×4): 25 ug via INTRAVENOUS

## 2019-04-24 MED ORDER — FENTANYL CITRATE (PF) 100 MCG/2ML IJ SOLN: 25.0000 ug | INTRAMUSCULAR | Status: DC | PRN

## 2019-04-24 MED ORDER — OXYCODONE HCL 5 MG PO TABS: 5.0000 mg | ORAL_TABLET | Freq: Four times a day (QID) | ORAL | 0 refills | Status: DC | PRN

## 2019-04-24 MED ORDER — LIDOCAINE HCL (CARDIAC) PF 100 MG/5ML IV SOSY
PREFILLED_SYRINGE | INTRAVENOUS | Status: DC | PRN
  Administered 2019-04-24: 80 mg via INTRATRACHEAL

## 2019-04-24 MED ORDER — MIDAZOLAM HCL 2 MG/2ML IJ SOLN
INTRAMUSCULAR | Status: DC | PRN
  Administered 2019-04-24: 2 mg via INTRAVENOUS

## 2019-04-24 MED ORDER — CELECOXIB 200 MG PO CAPS
200.0000 mg | ORAL_CAPSULE | ORAL | Status: AC
  Administered 2019-04-24: 200 mg via ORAL

## 2019-04-24 MED ORDER — CHLORHEXIDINE GLUCONATE CLOTH 2 % EX PADS: 6.0000 | MEDICATED_PAD | Freq: Once | CUTANEOUS | Status: DC

## 2019-04-24 MED ORDER — ACETAMINOPHEN 500 MG PO TABS
ORAL_TABLET | ORAL | Status: AC
  Filled 2019-04-24: qty 2

## 2019-04-24 MED ORDER — IBUPROFEN 800 MG PO TABS: 800.0000 mg | ORAL_TABLET | Freq: Three times a day (TID) | ORAL | 0 refills | Status: DC | PRN

## 2019-04-24 SURGICAL SUPPLY — 45 items
ADH SKN CLS APL DERMABOND .7 (GAUZE/BANDAGES/DRESSINGS)
APL PRP STRL LF DISP 70% ISPRP (MISCELLANEOUS) ×1
APL SKNCLS STERI-STRIP NONHPOA (GAUZE/BANDAGES/DRESSINGS)
BENZOIN TINCTURE PRP APPL 2/3 (GAUZE/BANDAGES/DRESSINGS) IMPLANT
BLADE SURG 10 STRL SS (BLADE) IMPLANT
BLADE SURG 15 STRL LF DISP TIS (BLADE) ×1 IMPLANT
BLADE SURG 15 STRL SS (BLADE) ×3
BNDG ELASTIC 4X5.8 VLCR STR LF (GAUZE/BANDAGES/DRESSINGS) IMPLANT
CANISTER SUCT 1200ML W/VALVE (MISCELLANEOUS) IMPLANT
CHLORAPREP W/TINT 26 (MISCELLANEOUS) ×3 IMPLANT
CLOSURE WOUND 1/2 X4 (GAUZE/BANDAGES/DRESSINGS)
COVER BACK TABLE 60X90IN (DRAPES) ×3 IMPLANT
COVER MAYO STAND STRL (DRAPES) ×3 IMPLANT
COVER WAND RF STERILE (DRAPES) IMPLANT
DECANTER SPIKE VIAL GLASS SM (MISCELLANEOUS) IMPLANT
DERMABOND ADVANCED (GAUZE/BANDAGES/DRESSINGS)
DERMABOND ADVANCED .7 DNX12 (GAUZE/BANDAGES/DRESSINGS) IMPLANT
DRAPE LAPAROTOMY 100X72 PEDS (DRAPES) ×3 IMPLANT
DRAPE UTILITY XL STRL (DRAPES) ×3 IMPLANT
DRSG OPSITE POSTOP 4X6 (GAUZE/BANDAGES/DRESSINGS) ×2 IMPLANT
ELECT COATED BLADE 2.86 ST (ELECTRODE) ×3 IMPLANT
ELECT REM PT RETURN 9FT ADLT (ELECTROSURGICAL) ×3
ELECTRODE REM PT RTRN 9FT ADLT (ELECTROSURGICAL) ×1 IMPLANT
GLOVE BIOGEL PI IND STRL 8 (GLOVE) ×1 IMPLANT
GLOVE BIOGEL PI INDICATOR 8 (GLOVE) ×2
GLOVE ECLIPSE 8.0 STRL XLNG CF (GLOVE) ×3 IMPLANT
GOWN STRL REUS W/ TWL LRG LVL3 (GOWN DISPOSABLE) ×2 IMPLANT
GOWN STRL REUS W/TWL LRG LVL3 (GOWN DISPOSABLE) ×6
NDL HYPO 25X1 1.5 SAFETY (NEEDLE) ×1 IMPLANT
NEEDLE HYPO 25X1 1.5 SAFETY (NEEDLE) ×3 IMPLANT
NS IRRIG 1000ML POUR BTL (IV SOLUTION) IMPLANT
PACK BASIN DAY SURGERY FS (CUSTOM PROCEDURE TRAY) ×3 IMPLANT
PENCIL SMOKE EVACUATOR (MISCELLANEOUS) ×3 IMPLANT
SLEEVE SCD COMPRESS KNEE MED (MISCELLANEOUS) ×3 IMPLANT
SPONGE LAP 4X18 RFD (DISPOSABLE) IMPLANT
STAPLER VISISTAT 35W (STAPLE) IMPLANT
STRIP CLOSURE SKIN 1/2X4 (GAUZE/BANDAGES/DRESSINGS) IMPLANT
SUT MON AB 4-0 PC3 18 (SUTURE) ×3 IMPLANT
SUT VICRYL 3-0 CR8 SH (SUTURE) ×3 IMPLANT
SUT VICRYL AB 3 0 TIES (SUTURE) IMPLANT
SYR CONTROL 10ML LL (SYRINGE) ×3 IMPLANT
TOWEL GREEN STERILE FF (TOWEL DISPOSABLE) ×6 IMPLANT
TUBE CONNECTING 20'X1/4 (TUBING)
TUBE CONNECTING 20X1/4 (TUBING) IMPLANT
YANKAUER SUCT BULB TIP NO VENT (SUCTIONS) IMPLANT

## 2019-04-24 NOTE — Interval H&P Note (Signed)
History and Physical Interval Note:  04/24/2019 11:22 AM  Rhonda Murray  has presented today for surgery, with the diagnosis of MASS.  The various methods of treatment have been discussed with the patient and family. After consideration of risks, benefits and other options for treatment, the patient has consented to  Procedure(s): EXCISION OF ABDOMINAL WALL MASS (N/A) as a surgical intervention.  The patient's history has been reviewed, patient examined, no change in status, stable for surgery.  I have reviewed the patient's chart and labs.  Questions were answered to the patient's satisfaction.     [place]

## 2019-04-24 NOTE — Anesthesia Postprocedure Evaluation (Signed)
Anesthesia Post Note  Patient: Rhonda Murray  Procedure(s) Performed: EXCISION OF ABDOMINAL WALL MASS (N/A Abdomen)     Patient location during evaluation: PACU Anesthesia Type: General Level of consciousness: awake Pain management: pain level controlled Respiratory status: spontaneous breathing Cardiovascular status: blood pressure returned to baseline Postop Assessment: no apparent nausea or vomiting Anesthetic complications: no    Last Vitals:  Vitals:   04/24/19 1315 04/24/19 1407  BP: 118/66 124/76  Pulse: 89 91  Resp: (!) 21 18  Temp:  36.9 C  SpO2: 97% 99%    Last Pain:  Vitals:   04/24/19 1407  TempSrc:   PainSc: 0-No pain                 [name]

## 2019-04-24 NOTE — Transfer of Care (Signed)
Immediate Anesthesia Transfer of Care Note  Patient: Rhonda Murray  Procedure(s) Performed: EXCISION OF ABDOMINAL WALL MASS (N/A Abdomen)  Patient Location: PACU  Anesthesia Type:General  Level of Consciousness: drowsy and patient cooperative  Airway & Oxygen Therapy: Patient Spontanous Breathing and Patient connected to face mask oxygen  Post-op Assessment: Report given to RN and Post -op Vital signs reviewed and stable  Post vital signs: Reviewed and stable  Last Vitals:  Vitals Value Taken Time  BP 126/73 04/24/19 1246  Temp    Pulse 97 04/24/19 1247  Resp 16 04/24/19 1247  SpO2 100 % 04/24/19 1247  Vitals shown include unvalidated device data.  Last Pain:  Vitals:   04/24/19 1123  TempSrc: Oral  PainSc: 0-No pain         Complications: No apparent anesthesia complications

## 2019-04-24 NOTE — Anesthesia Procedure Notes (Signed)
Procedure Name: LMA Insertion Date/Time: 04/24/2019 11:54 AM Performed by: Kathryne Hitch, CRNA Pre-anesthesia Checklist: Patient identified, Emergency Drugs available, Suction available and Patient being monitored Patient Re-evaluated:Patient Re-evaluated prior to induction Oxygen Delivery Method: Circle system utilized Preoxygenation: Pre-oxygenation with 100% oxygen Induction Type: IV induction Ventilation: Mask ventilation without difficulty LMA: LMA inserted LMA Size: 4.0 Tube type: Oral Number of attempts: 1 Placement Confirmation: positive ETCO2 Tube secured with: Tape Dental Injury: Teeth and Oropharynx as per pre-operative assessment

## 2019-04-24 NOTE — Discharge Instructions (Signed)
GENERAL SURGERY: POST OP INSTRUCTIONS  ######################################################################  EAT Gradually transition to a high fiber diet with a fiber supplement over the next few weeks after discharge.  Start with a pureed / full liquid diet (see below)  WALK Walk an hour a day.  Control your pain to do that.    CONTROL PAIN Control pain so that you can walk, sleep, tolerate sneezing/coughing, go up/down stairs.  HAVE A BOWEL MOVEMENT DAILY Keep your bowels regular to avoid problems.  OK to try a laxative to override constipation.  OK to use an antidairrheal to slow down diarrhea.  Call if not better after 2 tries  CALL IF YOU HAVE PROBLEMS/CONCERNS Call if you are still struggling despite following these instructions. Call if you have concerns not answered by these instructions  ######################################################################    1. DIET: Follow a light bland diet & liquids the first 24 hours after arrival home, such as soup, liquids, starches, etc.  Be sure to drink plenty of fluids.  Quickly advance to a usual solid diet within a few days.  Avoid fast food or heavy meals as your are more likely to get nauseated or have irregular bowels.  A low-fat, high-fiber diet for the rest of your life is ideal.   2. Take your usually prescribed home medications unless otherwise directed. 3. PAIN CONTROL: a. Pain is best controlled by a usual combination of three different methods TOGETHER: i. Ice/Heat ii. Over the counter pain medication iii. Prescription pain medication b. Most patients will experience some swelling and bruising around the incisions.  Ice packs or heating pads (30-60 minutes up to 6 times a day) will help. Use ice for the first few days to help decrease swelling and bruising, then switch to heat to help relax tight/sore spots and speed recovery.  Some people prefer to use ice alone, heat alone, alternating between ice & heat.   Experiment to what works for you.  Swelling and bruising can take several weeks to resolve.   c. It is helpful to take an over-the-counter pain medication regularly for the first few weeks.  Choose one of the following that works best for you: i. Naproxen (Aleve, etc)  Two 220mg tabs twice a day ii. Ibuprofen (Advil, etc) Three 200mg tabs four times a day (every meal & bedtime) iii. Acetaminophen (Tylenol, etc) 500-650mg four times a day (every meal & bedtime) d. A  prescription for pain medication (such as oxycodone, hydrocodone, etc) should be given to you upon discharge.  Take your pain medication as prescribed.  i. If you are having problems/concerns with the prescription medicine (does not control pain, nausea, vomiting, rash, itching, etc), please call us [phone] to see if we need to switch you to a different pain medicine that will work better for you and/or control your side effect better. ii. If you need a refill on your pain medication, please contact your pharmacy.  They will contact our office to request authorization. Prescriptions will not be filled after 5 pm or on week-ends. 4. Avoid getting constipated.  Between the surgery and the pain medications, it is common to experience some constipation.  Increasing fluid intake and taking a fiber supplement (such as Metamucil, Citrucel, FiberCon, MiraLax, etc) 1-2 times a day regularly will usually help prevent this problem from occurring.  A mild laxative (prune juice, Milk of Magnesia, MiraLax, etc) should be taken according to package directions if there are no bowel movements after 48 hours.   5. Wash /   shower every day.  You may shower over the dressings as they are waterproof.  Continue to shower over incision(s) after the dressing is off. 6. Remove your waterproof bandages 5 days after surgery.  You may leave the incision open to air.  You may have skin tapes (Steri Strips) covering the incision(s).  Leave them on until one week, then  remove.  You may replace a dressing/Band-Aid to cover the incision for comfort if you wish.      7. ACTIVITIES as tolerated:   a. You may resume regular (light) daily activities beginning the next day--such as daily self-care, walking, climbing stairs--gradually increasing activities as tolerated.  If you can walk 30 minutes without difficulty, it is safe to try more intense activity such as jogging, treadmill, bicycling, low-impact aerobics, swimming, etc. b. Save the most intensive and strenuous activity for last such as sit-ups, heavy lifting, contact sports, etc  Refrain from any heavy lifting or straining until you are off narcotics for pain control.   c. DO NOT PUSH THROUGH PAIN.  Let pain be your guide: If it hurts to do something, don't do it.  Pain is your body warning you to avoid that activity for another week until the pain goes down. d. You may drive when you are no longer taking prescription pain medication, you can comfortably wear a seatbelt, and you can safely maneuver your car and apply brakes. e. You may have sexual intercourse when it is comfortable.  8. FOLLOW UP in our office a. Please call [facility] at [phone] to set up an appointment to see your surgeon in the office for a follow-up appointment approximately 2-3 weeks after your surgery. b. Make sure that you call for this appointment the day you arrive home to insure a convenient appointment time. 9. IF YOU HAVE DISABILITY OR FAMILY LEAVE FORMS, BRING THEM TO THE OFFICE FOR PROCESSING.  DO NOT GIVE THEM TO YOUR DOCTOR.   WHEN TO CALL US [phone]: 1. Poor pain control 2. Reactions / problems with new medications (rash/itching, nausea, etc)  3. Fever over 101.5 F (38.5 C) 4. Worsening swelling or bruising 5. Continued bleeding from incision. 6. Increased pain, redness, or drainage from the incision 7. Difficulty breathing / swallowing   The clinic staff is available to answer your questions during regular  business hours (8:30am-5pm).  Please don't hesitate to call and ask to speak to one of our nurses for clinical concerns.   If you have a medical emergency, go to the nearest emergency room or call 911.  A surgeon from [facility] is always on call at the hospitals   [facility] [address], [address], [place], [place]  [address] ? MAIN: [phone] ? TOLL FREE: [phone] ?  FAX [phone] [email or web address]    Post Anesthesia Home Care Instructions  Activity: Get plenty of rest for the remainder of the day. A responsible individual must stay with you for 24 hours following the procedure.  For the next 24 hours, DO NOT: -Drive a car -Operate machinery -Drink alcoholic beverages -Take any medication unless instructed by your physician -Make any legal decisions or sign important papers.  Meals: Start with liquid foods such as gelatin or soup. Progress to regular foods as tolerated. Avoid greasy, spicy, heavy foods. If nausea and/or vomiting occur, drink only clear liquids until the nausea and/or vomiting subsides. Call your physician if vomiting continues.  Special Instructions/Symptoms: Your throat may feel dry or   from the anesthesia or the breathing tube placed in your throat during surgery. If this causes discomfort, gargle with warm salt water. The discomfort should disappear within 24 hours.  If you had a scopolamine patch placed behind your ear for the management of post- operative nausea and/or vomiting:  1. The medication in the patch is effective for 72 hours, after which it should be removed.  Wrap patch in a tissue and discard in the trash. Wash hands thoroughly with soap and water. 2. You may remove the patch earlier than 72 hours if you experience unpleasant side effects which may include dry mouth, dizziness or visual disturbances. 3. Avoid touching the patch. Wash your hands with soap and water after contact  with the patch.  Do NOT take any Tylenol before 5:30PM today.

## 2019-04-24 NOTE — Op Note (Signed)
Preoperative diagnosis: Abdominal wall mass involving the right external oblique measuring 5 cm x 4 cm  Postoperative diagnosis: Mass right external oblique measuring 5 cm x 4 cm subfascial  Procedure: Excision of abdominal wall mass  Surgeon: Erroll Luna, MD  Anesthesia: General with 0.25% Marcaine local  EBL: Minimal  Specimen: Mass to include the fascia of the right external oblique measuring 5 cm x 4 cm to pathology  Drains: None  Indications for procedure: The patient presents with a painful right lower quadrant abdominal wall mass.  CT scan showed no evidence of hernia and showed a mass that was on the anterior portion of the right external oblique fascia.  Excision recommended due to pain.The procedure has been discussed with the patient.  Alternative therapies have been discussed with the patient.  Operative risks include bleeding,  Infection,  Organ injury,  Nerve injury,  Blood vessel injury,  DVT,  Pulmonary embolism,  Death,  And possible reoperation.  Medical management risks include worsening of present situation.  The success of the procedure is 50 -90 % at treating patients symptoms.  The patient understands and agrees to proceed.  Description of procedure: The patient was met in the holding area and questions were answered.  The area was marked with the assistance of the patient.  She was then taken back to the operative room.  She is placed supine upon the OR table.  After induction of general esthesia the right lower quadrant was prepped and draped in sterile fashion timeout performed.  The mass was palpable through an old C-section scar on the right.  Incision was made to the old scar dissection was carried down until the mass could be palpated.  This was ingrown into the fascia of the right external oblique muscle above the inguinal canal.  I incised the fascia above the mass with a grossly negative margin and then dissect the mass out of the fascia.  The posterior margin  was clear as well.  This was the muscle fibers of the external oblique.  These were not violated.  The entire mass was excised out of the fascia in its entirety with a grossly negative margin.  Hemostasis achieved.  Local anesthetic infiltrated throughout the cavity and into the muscle.  Wound closed with 3-0 Vicryl and 4-0 Monocryl.  Dermabond applied.  All counts found to be correct.  The patient awoke extubated taken recovery in satisfactory condition.

## 2019-04-24 NOTE — H&P (Signed)
Rhonda Murray Documented: 11/25/2018 2:10 PM Location: [facility] Patient #: [id number] DOB: 10-31-1975 Single / Language: [name] / Race: Black or African American Female   History of Present Illness [name] A. Kayvan Hoefling MD; 11/25/2018 3:53 PM) Patient words: Patient sent at work at the request of [name] fnp for right lower quadrant abdominal pain. The patient states she's had many months of pain along the right side of an old C-section scar. She states she can feel a bulge when she is standing up. It goes away when she lies supine. The pain is described as burning made worse with coughing, sneezing or lifting. Its intermittent and of moderate severity.  The patient is a 44 year old female.   Past Surgical History [name], [place]; 11/25/2018 2:10 PM) No pertinent past surgical history   Diagnostic Studies History [name], [place]; 11/25/2018 2:10 PM) Colonoscopy  never Mammogram  within last year Pap Smear  1-5 years ago  Allergies [name], [place]; 11/25/2018 2:11 PM) No Known Drug Allergies [11/25/2018]: Allergies Reconciled   Medication History [name], [name]; 11/25/2018 2:11 PM) buPROPion HCl ER (SR) (150MG  Tablet ER 12HR, Oral) Active. Naproxen (500MG  Tablet, Oral) Active. Medications Reconciled  Social History [name], [place]; 11/25/2018 2:10 PM) Alcohol use  Occasional alcohol use. Caffeine use  Carbonated beverages. No drug use  Tobacco use  Former smoker.  Family History [name], [name]; 11/25/2018 2:10 PM) Hypertension  Mother.  Pregnancy / Birth History [name], [place]; 11/25/2018 2:10 PM) Age at menarche  29 years. Contraceptive History  Intrauterine device, Oral contraceptives. Gravida  2 Irregular periods  Length (months) of breastfeeding  3-6 Maternal age  57-30 Para  2  Other Problems [name], [place]; 11/25/2018 2:10 PM) Depression     Review of Systems [name] [name];  11/25/2018 2:10 PM) General Not Present- Appetite Loss.  Vitals [name] [name]; 11/25/2018 2:12 PM) 11/25/2018 2:11 PM Weight: 312 lb Height: 67in Body Surface Area: 2.44 m Body Mass Index: 48.87 kg/m  Temp.: 98.65F  Pulse: 102 (Regular)  BP: 160/90 (Sitting, Left Arm, Standard)       Physical Exam (Wilson Sample A. Tierre Netto MD; 11/25/2018 3:54 PM) General Mental Status-Alert. General Appearance-Consistent with stated age. Hydration-Well hydrated. Voice-Normal.  Chest and Lung Exam Note: WOB normal no wheezing   Cardiovascular Note: NSR   Abdomen Note: c section scar noted bulge at right portin of scar 3 cm in size soft reducible no rebound or guarding   Neurologic Neurologic evaluation reveals -alert and oriented x 3 with no impairment of recent or remote memory. Mental Status-Normal.    Assessment & Plan (Quantae Martel A. Elek Holderness MD; 11/25/2018 3:55 PM) ABDOMINAL PAIN (R10.9) Impression: CT scan to evaluate for a hernia at incision Discussed surgical repair with mesh if needed. More recommendations to follow after imaging complete Current Plans Pt Education - [facility] Free Text Education/Instructions: discussed with patient and provided information. Pt Education - [facility] Free Text Education/Instructions: discussed with patient and provided information. REDUCIBLE BULGE OF ABDOMINAL WALL (R19.00)  Mass abdominal wall- CT to evaluate  Recommend excision if endometroma due to pain      The procedure has been discussed with the patient.  Alternative therapies have been discussed with the patient.  Operative risks include bleeding,  Infection,  Organ injury,  Nerve injury,  Blood vessel injury,  DVT,  Pulmonary embolism,  Death,  And possible reoperation.  Medical management risks include worsening of present situation.  The success of the procedure is  50 -90 % at treating patients symptoms.  The patient understands and agrees to  proceed.

## 2019-04-25 ENCOUNTER — Encounter: Payer: Self-pay | Admitting: *Deleted

## 2019-04-25 LAB — SURGICAL PATHOLOGY

## 2019-04-28 ENCOUNTER — Encounter: Payer: Self-pay | Admitting: Nurse Practitioner

## 2019-04-28 ENCOUNTER — Other Ambulatory Visit: Payer: Self-pay

## 2019-04-28 ENCOUNTER — Ambulatory Visit (INDEPENDENT_AMBULATORY_CARE_PROVIDER_SITE_OTHER): Payer: 59 | Admitting: Nurse Practitioner

## 2019-04-28 VITALS — BP 122/78 | HR 91 | Temp 98.3°F | Ht 68.0 in | Wt 313.8 lb

## 2019-04-28 DIAGNOSIS — F329 Major depressive disorder, single episode, unspecified: Secondary | ICD-10-CM | POA: Diagnosis not present

## 2019-04-28 DIAGNOSIS — R1031 Right lower quadrant pain: Secondary | ICD-10-CM

## 2019-04-28 DIAGNOSIS — Z6841 Body Mass Index (BMI) 40.0 and over, adult: Secondary | ICD-10-CM

## 2019-04-28 DIAGNOSIS — R7309 Other abnormal glucose: Secondary | ICD-10-CM | POA: Diagnosis not present

## 2019-04-28 DIAGNOSIS — F32A Depression, unspecified: Secondary | ICD-10-CM

## 2019-04-28 MED ORDER — BUPROPION HCL ER (SR) 150 MG PO TB12: 150.0000 mg | ORAL_TABLET | Freq: Two times a day (BID) | ORAL | 3 refills | Status: DC

## 2019-04-28 NOTE — Progress Notes (Signed)
This visit occurred during the SARS-CoV-2 public health emergency.  Safety protocols were in place, including screening questions prior to the visit, additional usage of staff PPE, and extensive cleaning of exam room while observing appropriate contact time as indicated for disinfecting solutions.  Subjective:     Patient ID: Rhonda Murray , female    DOB: June 16, 1975 , 44 y.o.   MRN: [id number]   Chief Complaint  Patient presents with  . Post-op Problem    HPI  Wt Readings from Last 3 Encounters: 04/28/19 : (!) 313 lb 12.8 oz (142.3 kg) 04/24/19 : (!) 312 lb 6.3 oz (141.7 kg) 03/18/18 : 295 lb 6.4 oz (134 kg)   She had adhesions removed 4 days ago, she is doing a lot better able to move around more.  She is to follow up with Surgery in May.  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    She is ready to work on her weight loss. She was trying to walk more, at least 3 days a week for 20 minutes.  She does not like her neighborhood so she would rather go to a park.  She has tried the Bariatric clinic and phentermine - she had extreme dry mouth had cracks in her mouth corners. She has also tried [facility] - she also did phentermine with them as well.  "this is the biggest I have ever been"  She is wearing her CPAP as much as possible.  She had genetic testing that showed she craves sweets.  She has done [employer] and juices, cabbage soup, tried cut out carbs.  She has not done intermittent fasting.    She is interested in the covid vaccine   Depression        This is a chronic problem.  The current episode started more than 1 month ago.   The onset quality is gradual.   The problem has been waxing and waning since onset.  Associated symptoms include no decreased concentration, no fatigue, no hopelessness and no headaches.  Treatments tried: she has been making her medication stretch due to not having enough to  last.    Compliance with treatment is variable.   Pertinent negatives include no chronic fatigue syndrome.    Past Medical History:  Diagnosis Date  . Anxiety unknown  . Medical history non-contributory   . Sleep apnea      Family History  Problem Relation Age of Onset  . Stroke Mother   . Hypertension Mother      Current Outpatient Medications:  .  buPROPion (WELLBUTRIN SR) 150 MG 12 hr tablet, Take 1 tablet (150 mg total) by mouth daily., Disp: 90 tablet, Rfl: 0 .  ibuprofen (ADVIL) 800 MG tablet, Take 1 tablet (800 mg total) by mouth every 8 (eight) hours as needed., Disp: 30 tablet, Rfl: 0 .  oxyCODONE (OXY IR/ROXICODONE) 5 MG immediate release tablet, Take 1 tablet (5 mg total) by mouth every 6 (six) hours as needed for severe pain. (Patient not taking: Reported on 04/28/2019), Disp: 15 tablet, Rfl: 0   No Known Allergies   Review of Systems  Constitutional: Negative.  Negative for fatigue.  Respiratory: Negative.   Cardiovascular: Negative.  Negative for chest pain, palpitations and leg swelling.  Gastrointestinal: Negative.   Neurological: Negative for dizziness and headaches.  Psychiatric/Behavioral: Positive for depression. Negative for decreased concentration.  Today's Vitals   04/28/19 0931  BP: 122/78  Pulse: 91  Temp: 98.3 F (36.8 C)  TempSrc: Oral  Weight: (!) 313 lb 12.8 oz (142.3 kg)  Height: 5\' 8"  (1.727 m)  PainSc: 0-No pain   Body mass index is 47.71 kg/m.   Objective:  Physical Exam Constitutional:      Appearance: Normal appearance.  Cardiovascular:     Rate and Rhythm: Normal rate and regular rhythm.  Pulmonary:     Effort: Pulmonary effort is normal. No respiratory distress.     Breath sounds: Normal breath sounds.  Skin:    Capillary Refill: Capillary refill takes less than 2 seconds.  Neurological:     General: No focal deficit present.     Mental Status: She is alert and oriented to person, place, and time.     Cranial Nerves:  No cranial nerve deficit.  Psychiatric:        Mood and Affect: Mood normal.        Behavior: Behavior normal.        Thought Content: Thought content normal.        Judgment: Judgment normal.         Assessment And Plan:     1. Depression, unspecified depression type  Chronic, she has been without her medication for approximately 3-4 days   Will increase to BID to help with mood and hopefully help with cravings. - buPROPion (WELLBUTRIN SR) 150 MG 12 hr tablet; Take 1 tablet (150 mg total) by mouth 2 (two) times daily.  Dispense: 60 tablet; Refill: 3  2. Abnormal glucose  Will check HgbA1c today  Discussed importance of low sugar and starch diet.    3. Class 3 severe obesity due to excess calories without serious comorbidity with body mass index (BMI) of 45.0 to 49.9 in adult [facility])  ASK- Patient brought up conversation on weight loss. Given permission to discuss weight today ASSESS - Body mass index is 47.71 kg/m., WAIST CIRCUMFERENCE 57.75 ADVISE  - on risk of cardiovascular disease currently has a diagnosis of hypertension, on medications. AGREE - she is ready to focus on weight loss. She is planning to increase her water intake to 4 bottles of water a dayto increase her physical activity of walking back to walking daily to include during lunch time ASSIST - given weight loss resource book, feels barriers have been related to just getting off track  Goal to increase water intake to 4 bottles.     [name], FNP    THE PATIENT IS ENCOURAGED TO PRACTICE SOCIAL DISTANCING DUE TO THE COVID-19 PANDEMIC.

## 2019-04-28 NOTE — Patient Instructions (Addendum)
[employer]astic App to help with intermittent fasting.    The Obesity Code book.   I encourage you to write out what you are eating, exercising and water intake.     Exercising to Lose Weight Exercise is structured, repetitive physical activity to improve fitness and health. Getting regular exercise is important for everyone. It is especially important if you are overweight. Being overweight increases your risk of heart disease, stroke, diabetes, high blood pressure, and several types of cancer. Reducing your calorie intake and exercising can help you lose weight. Exercise is usually categorized as moderate or vigorous intensity. To lose weight, most people need to do a certain amount of moderate-intensity or vigorous-intensity exercise each week. Moderate-intensity exercise  Moderate-intensity exercise is any activity that gets you moving enough to burn at least three times more energy (calories) than if you were sitting. Examples of moderate exercise include:  Walking a mile in 15 minutes.  Doing light yard work.  Biking at an easy pace. Most people should get at least 150 minutes (2 hours and 30 minutes) a week of moderate-intensity exercise to maintain their body weight. Vigorous-intensity exercise Vigorous-intensity exercise is any activity that gets you moving enough to burn at least six times more calories than if you were sitting. When you exercise at this intensity, you should be working hard enough that you are not able to carry on a conversation. Examples of vigorous exercise include:  Running.  Playing a team sport, such as football, basketball, and soccer.  Jumping rope. Most people should get at least 75 minutes (1 hour and 15 minutes) a week of vigorous-intensity exercise to maintain their body weight. How can exercise affect me? When you exercise enough to burn more calories than you eat, you lose weight. Exercise also reduces body fat and builds muscle. The more muscle you  have, the more calories you burn. Exercise also:  Improves mood.  Reduces stress and tension.  Improves your overall fitness, flexibility, and endurance.  Increases bone strength. The amount of exercise you need to lose weight depends on:  Your age.  The type of exercise.  Any health conditions you have.  Your overall physical ability. Talk to your health care provider about how much exercise you need and what types of activities are safe for you. What actions can I take to lose weight? Nutrition   Make changes to your diet as told by your health care provider or diet and nutrition specialist (dietitian). This may include: ? Eating fewer calories. ? Eating more protein. ? Eating less unhealthy fats. ? Eating a diet that includes fresh fruits and vegetables, whole grains, low-fat dairy products, and lean protein. ? Avoiding foods with added fat, salt, and sugar.  Drink plenty of water while you exercise to prevent dehydration or heat stroke. Activity  Choose an activity that you enjoy and set realistic goals. Your health care provider can help you make an exercise plan that works for you.  Exercise at a moderate or vigorous intensity most days of the week. ? The intensity of exercise may vary from person to person. You can tell how intense a workout is for you by paying attention to your breathing and heartbeat. Most people will notice their breathing and heartbeat get faster with more intense exercise.  Do resistance training twice each week, such as: ? Push-ups. ? Sit-ups. ? Lifting weights. ? Using resistance bands.  Getting short amounts of exercise can be just as helpful as long structured periods  of exercise. If you have trouble finding time to exercise, try to include exercise in your daily routine. ? Get up, stretch, and walk around every 30 minutes throughout the day. ? Go for a walk during your lunch break. ? Park your car farther away from your  destination. ? If you take public transportation, get off one stop early and walk the rest of the way. ? Make phone calls while standing up and walking around. ? Take the stairs instead of elevators or escalators.  Wear comfortable clothes and shoes with good support.  Do not exercise so much that you hurt yourself, feel dizzy, or get very short of breath. Where to find more information  U.S. Department of Health and Human Services: BondedCompany.at  Centers for Disease Control and Prevention (CDC): http://www.wolf.info/ Contact a health care provider:  Before starting a new exercise program.  If you have questions or concerns about your weight.  If you have a medical problem that keeps you from exercising. Get help right away if you have any of the following while exercising:  Injury.  Dizziness.  Difficulty breathing or shortness of breath that does not go away when you stop exercising.  Chest pain.  Rapid heartbeat. Summary  Being overweight increases your risk of heart disease, stroke, diabetes, high blood pressure, and several types of cancer.  Losing weight happens when you burn more calories than you eat.  Reducing the amount of calories you eat in addition to getting regular moderate or vigorous exercise each week helps you lose weight. This information is not intended to replace advice given to you by your health care provider. Make sure you discuss any questions you have with your health care provider. Document Revised: 01/08/2017 Document Reviewed: 01/08/2017 Elsevier Patient Education  2020 [place].  COVID-19 Vaccine Information can be found at: ShippingScam.co.uk For questions related to vaccine distribution or appointments, please email [email or web address] or call [phone].

## 2019-04-29 LAB — BMP8+EGFR
BUN/Creatinine Ratio: 11 (ref 9–23)
BUN: 10 mg/dL (ref 6–24)
CO2: 20 mmol/L (ref 20–29)
Calcium: 9.3 mg/dL (ref 8.7–10.2)
Chloride: 104 mmol/L (ref 96–106)
Creatinine, Ser: 0.89 mg/dL (ref 0.57–1.00)
GFR calc Af Amer: 93 mL/min/{1.73_m2} (ref >59)
GFR calc non Af Amer: 81 mL/min/{1.73_m2} (ref >59)
Glucose: 91 mg/dL (ref 65–99)
Potassium: 4.1 mmol/L (ref 3.5–5.2)
Sodium: 138 mmol/L (ref 134–144)

## 2019-04-29 LAB — INSULIN, RANDOM: INSULIN: 29.2 u[IU]/mL — ABNORMAL HIGH (ref 2.6–24.9)

## 2019-04-29 LAB — HEMOGLOBIN A1C
Est. average glucose Bld gHb Est-mCnc: 114 mg/dL
Hgb A1c MFr Bld: 5.6 % (ref 4.8–5.6)

## 2019-04-29 LAB — TSH: TSH: 1.37 u[IU]/mL (ref 0.450–4.500)

## 2019-05-04 ENCOUNTER — Telehealth: Payer: 59

## 2019-05-05 ENCOUNTER — Other Ambulatory Visit: Payer: Self-pay | Admitting: Nurse Practitioner

## 2019-05-05 DIAGNOSIS — R7309 Other abnormal glucose: Secondary | ICD-10-CM

## 2019-05-05 MED ORDER — METFORMIN HCL 500 MG PO TABS: 500.0000 mg | ORAL_TABLET | Freq: Two times a day (BID) | ORAL | 1 refills | Status: DC

## 2019-05-22 ENCOUNTER — Other Ambulatory Visit: Payer: Self-pay | Admitting: Nurse Practitioner

## 2019-05-22 DIAGNOSIS — F32A Depression, unspecified: Secondary | ICD-10-CM

## 2019-06-13 ENCOUNTER — Other Ambulatory Visit: Payer: Self-pay | Admitting: Nurse Practitioner

## 2019-06-13 DIAGNOSIS — F32A Depression, unspecified: Secondary | ICD-10-CM

## 2019-06-30 ENCOUNTER — Ambulatory Visit (INDEPENDENT_AMBULATORY_CARE_PROVIDER_SITE_OTHER): Payer: 59 | Admitting: Nurse Practitioner

## 2019-06-30 ENCOUNTER — Other Ambulatory Visit: Payer: Self-pay

## 2019-06-30 ENCOUNTER — Encounter: Payer: Self-pay | Admitting: Nurse Practitioner

## 2019-06-30 VITALS — BP 124/80 | HR 89 | Temp 98.5°F | Ht 66.8 in | Wt 312.6 lb

## 2019-06-30 DIAGNOSIS — Z6841 Body Mass Index (BMI) 40.0 and over, adult: Secondary | ICD-10-CM | POA: Diagnosis not present

## 2019-06-30 MED ORDER — INSULIN PEN NEEDLE 32G X 6 MM MISC: 1.0000 | Freq: Every day | 1 refills | Status: DC

## 2019-06-30 MED ORDER — SAXENDA 18 MG/3ML ~~LOC~~ SOPN: 3.0000 mg | PEN_INJECTOR | Freq: Every day | SUBCUTANEOUS | 1 refills | Status: DC

## 2019-06-30 NOTE — Progress Notes (Signed)
This visit occurred during the SARS-CoV-2 public health emergency.  Safety protocols were in place, including screening questions prior to the visit, additional usage of staff PPE, and extensive cleaning of exam room while observing appropriate contact time as indicated for disinfecting solutions.  Subjective:     Patient ID: Rhonda Murray , female    DOB: [birth year] , 44 y.o.   MRN: [id number]   Chief Complaint  Patient presents with  . Weight Check    HPI  Here for weight check   Wt Readings from Last 3 Encounters: 06/30/19 : (!) 312 lb 9.6 oz (141.8 kg) 04/28/19 : (!) 313 lb 12.8 oz (142.3 kg) 04/24/19 : (!) 312 lb 6.3 oz (141.7 kg)  She feels her weight has been up and down.  When her stress levels go up her sweet tooth goes up.  She is walking 30 minutes 2 days a week to a walking video.  She is forcing herself to eat, if she eats a big breakfast she will not eat.  She has taken phentermine in the past caused her dry mouth and gives her insomnia.      Past Medical History:  Diagnosis Date  . Anxiety unknown  . Medical history non-contributory   . Sleep apnea      Family History  Problem Relation Age of Onset  . Stroke Mother   . Hypertension Mother      Current Outpatient Medications:  .  buPROPion (WELLBUTRIN SR) 150 MG 12 hr tablet, TAKE 1 TABLET BY MOUTH EVERY DAY, Disp: 30 tablet, Rfl: 3 .  ibuprofen (ADVIL) 800 MG tablet, Take 1 tablet (800 mg total) by mouth every 8 (eight) hours as needed., Disp: 30 tablet, Rfl: 0 .  metFORMIN (GLUCOPHAGE) 500 MG tablet, Take 1 tablet (500 mg total) by mouth 2 (two) times daily with a meal., Disp: 180 tablet, Rfl: 1   No Known Allergies   Review of Systems  Constitutional: Negative.  Negative for fatigue.  Respiratory: Negative.   Cardiovascular: Negative.  Negative for chest pain, palpitations and leg swelling.  Endocrine: Negative for polydipsia, polyphagia and polyuria.  Neurological: Negative for dizziness and  headaches.  Psychiatric/Behavioral: Negative.      Today's Vitals   06/30/19 1621  BP: 124/80  Pulse: 89  Temp: 98.5 F (36.9 C)  TempSrc: Oral  Weight: (!) 312 lb 9.6 oz (141.8 kg)  Height: 5' 6.8" (1.697 m)  PainSc: 0-No pain   Body mass index is 49.25 kg/m.   Objective:  Physical Exam Vitals reviewed.  Constitutional:      General: She is not in acute distress.    Appearance: Normal appearance.  Cardiovascular:     Rate and Rhythm: Normal rate and regular rhythm.     Pulses: Normal pulses.     Heart sounds: Normal heart sounds. No murmur heard.   Pulmonary:     Effort: Pulmonary effort is normal. No respiratory distress.     Breath sounds: Normal breath sounds.  Neurological:     General: No focal deficit present.     Mental Status: She is alert and oriented to person, place, and time.     Cranial Nerves: No cranial nerve deficit.  Psychiatric:        Mood and Affect: Mood normal.        Behavior: Behavior normal.        Thought Content: Thought content normal.        Judgment: Judgment normal.  Assessment And Plan:     1. Morbid obesity with BMI of 45.0-49.9, adult ([place])  Has tried phentermine in the past had side effects to include dry mouth  I will try her on Saxenda pending insurance approval  Discussed side effects to include nausea, sudden abdominal pain and difficulty with swallowing  Goal to lose 1-2 lbs weekly - Liraglutide -Weight Management (SAXENDA) 18 MG/3ML SOPN; Inject 0.5 mLs (3 mg total) into the skin daily.  Dispense: 5 pen; Refill: 1   [name], FNP    THE PATIENT IS ENCOURAGED TO PRACTICE SOCIAL DISTANCING DUE TO THE COVID-19 PANDEMIC.

## 2019-07-01 ENCOUNTER — Encounter: Payer: Self-pay | Admitting: Nurse Practitioner

## 2019-07-01 NOTE — Telephone Encounter (Signed)
Okay, thank you. If that does not work we can try [employer]

## 2019-07-06 ENCOUNTER — Encounter: Payer: Self-pay | Admitting: Nurse Practitioner

## 2019-07-15 ENCOUNTER — Ambulatory Visit: Payer: 59

## 2019-07-15 ENCOUNTER — Other Ambulatory Visit: Payer: Self-pay

## 2019-07-15 VITALS — BP 124/74 | HR 96 | Temp 97.9°F | Ht 67.4 in | Wt 308.2 lb

## 2019-07-15 NOTE — Progress Notes (Signed)
Pt is here today for a wegovy teaching. Pt explained how to use the pen and side effects. Pt encouraged to call the office with any concerns. Pt will follow up in four weeks. Savings card given

## 2019-08-04 ENCOUNTER — Other Ambulatory Visit: Payer: Self-pay

## 2019-08-04 ENCOUNTER — Encounter: Payer: Self-pay | Admitting: Nurse Practitioner

## 2019-08-04 MED ORDER — WEGOVY 0.5 MG/0.5ML ~~LOC~~ SOAJ: 0.5000 mg | SUBCUTANEOUS | 0 refills | Status: DC

## 2019-08-04 MED FILL — WEGOVY 0.5 MG/0.5ML SOAJ: 0.5 | 28 days supply | Qty: 2 | Fill #0

## 2019-08-12 ENCOUNTER — Ambulatory Visit (INDEPENDENT_AMBULATORY_CARE_PROVIDER_SITE_OTHER): Payer: 59 | Admitting: Nurse Practitioner

## 2019-08-12 ENCOUNTER — Other Ambulatory Visit: Payer: Self-pay

## 2019-08-12 ENCOUNTER — Encounter: Payer: Self-pay | Admitting: Nurse Practitioner

## 2019-08-12 VITALS — BP 120/84 | HR 93 | Temp 98.3°F | Ht 66.8 in | Wt 302.6 lb

## 2019-08-12 DIAGNOSIS — Z6841 Body Mass Index (BMI) 40.0 and over, adult: Secondary | ICD-10-CM

## 2019-08-12 DIAGNOSIS — G4733 Obstructive sleep apnea (adult) (pediatric): Secondary | ICD-10-CM

## 2019-08-12 MED ORDER — WEGOVY 1 MG/0.5ML ~~LOC~~ SOAJ: 1.0000 mg | SUBCUTANEOUS | 0 refills | Status: DC

## 2019-08-12 MED FILL — WEGOVY 1 MG/0.5ML SOAJ: 1 | 28 days supply | Qty: 2 | Fill #0

## 2019-08-12 NOTE — Patient Instructions (Signed)

## 2019-08-12 NOTE — Progress Notes (Signed)
This visit occurred during the SARS-CoV-2 public health emergency.  Safety protocols were in place, including screening questions prior to the visit, additional usage of staff PPE, and extensive cleaning of exam room while observing appropriate contact time as indicated for disinfecting solutions.  Subjective:     Patient ID: Rhonda Murray , female    DOB: [birth year] , 44 y.o.   MRN: [id number]   Chief Complaint  Patient presents with  . Weight Check    HPI  Wt Readings from Last 3 Encounters: 08/12/19 : (!) 302 lb 9.6 oz (137.3 kg) 07/15/19 : (!) 308 lb 3.2 oz (139.8 kg) 06/30/19 : (!) 312 lb 9.6 oz (141.8 kg)  She is taking the Wegovy 0.5 mg weekly she likes the medication started today.  She is having nausea but nothing out of the ordinary. She is having a little bit of constipation but has improved recently.  She is drinking at least 2 bottles of water a day.  Her clothes are not fitting as tight.  She has been finding exercises on instagram.      Past Medical History:  Diagnosis Date  . Anxiety unknown  . Medical history non-contributory   . Sleep apnea      Family History  Problem Relation Age of Onset  . Stroke Mother   . Hypertension Mother      Current Outpatient Medications:  .  ibuprofen (ADVIL) 800 MG tablet, Take 1 tablet (800 mg total) by mouth every 8 (eight) hours as needed., Disp: 30 tablet, Rfl: 0 .  Insulin Pen Needle 32G X 6 MM MISC, 1 each by Does not apply route daily., Disp: 100 each, Rfl: 1 .  metFORMIN (GLUCOPHAGE) 500 MG tablet, Take 1 tablet (500 mg total) by mouth 2 (two) times daily with a meal., Disp: 180 tablet, Rfl: 1 .  buPROPion (WELLBUTRIN SR) 150 MG 12 hr tablet, Take 1 tablet (150 mg total) by mouth 2 (two) times daily., Disp: 180 tablet, Rfl: 1 .  Semaglutide-Weight Management (WEGOVY) 1.7 MG/0.75ML SOAJ, Inject 0.75 mLs (1.7 mg total) into the skin once a week., Disp: 3 mL, Rfl: 0   No Known Allergies   Review of Systems   Constitutional: Negative.   Respiratory: Negative.   Cardiovascular: Negative.  Negative for chest pain, palpitations and leg swelling.  Gastrointestinal: Negative.   Musculoskeletal: Negative.   Skin: Negative.   Neurological: Negative for dizziness and headaches.  Psychiatric/Behavioral: Negative.      Today's Vitals   08/12/19 1551  BP: 120/84  Pulse: 93  Temp: 98.3 F (36.8 C)  TempSrc: Oral  Weight: (!) 302 lb 9.6 oz (137.3 kg)  Height: 5' 6.8" (1.697 m)  PainSc: 0-No pain   Body mass index is 47.68 kg/m.   Objective:  Physical Exam Constitutional:      Appearance: Normal appearance. She is obese.  Cardiovascular:     Rate and Rhythm: Normal rate and regular rhythm.     Pulses: Normal pulses.     Heart sounds: Normal heart sounds.  Pulmonary:     Effort: Pulmonary effort is normal.     Breath sounds: Normal breath sounds.  Skin:    General: Skin is warm and dry.  Neurological:     General: No focal deficit present.     Mental Status: She is alert and oriented to person, place, and time.     Cranial Nerves: No cranial nerve deficit.  Psychiatric:        Mood and  Affect: Mood normal.        Behavior: Behavior normal.        Thought Content: Thought content normal.        Judgment: Judgment normal.         Assessment And Plan:     1. Class 3 severe obesity due to excess calories without serious comorbidity with body mass index (BMI) of 45.0 to 49.9 in adult [facility])  So far doing well with Wegovy, tolerating well  Continue with weight loss and attempting to eat healthy  2. Obstructive sleep apnea syndrome  Discussed this can affect her ability to lose weight and the risk for cardiac events      Patient was given opportunity to ask questions. Patient verbalized understanding of the plan and was able to repeat key elements of the plan. All questions were answered to their satisfaction.  [name], FNP   I, [name], FNP, have reviewed all  documentation for this visit. The documentation on 09/10/19 for the exam, diagnosis, procedures, and orders are all accurate and complete.  THE PATIENT IS ENCOURAGED TO PRACTICE SOCIAL DISTANCING DUE TO THE COVID-19 PANDEMIC.

## 2019-08-29 ENCOUNTER — Ambulatory Visit (INDEPENDENT_AMBULATORY_CARE_PROVIDER_SITE_OTHER): Payer: 59

## 2019-08-29 DIAGNOSIS — Z23 Encounter for immunization: Secondary | ICD-10-CM

## 2019-08-29 MED FILL — WEGOVY 1 MG/0.5ML SOAJ: 1 | 28 days supply | Qty: 2 | Fill #0

## 2019-08-29 NOTE — Progress Notes (Signed)
   Covid-19 Vaccination Clinic  Name:  Rhonda Murray    MRN: [id number] DOB: 1975/12/06  08/29/2019  [name] was observed post Covid-19 immunization for 15 minutes without incident. She was provided with Vaccine Information Sheet and instruction to access the V-Safe system.   [name] was instructed to call 911 with any severe reactions post vaccine: Marland Kitchen Difficulty breathing  . Swelling of face and throat  . A fast heartbeat  . A bad rash all over body  . Dizziness and weakness   Immunizations Administered    Name Date Dose VIS Date Route   Moderna COVID-19 Vaccine 08/29/2019  9:40 AM 0.5 mL 12/2018 Intramuscular   Manufacturer: Levan Hurst   Lot: [id number]   [place]: [id number]

## 2019-09-01 ENCOUNTER — Ambulatory Visit: Payer: 59 | Admitting: Nurse Practitioner

## 2019-09-04 ENCOUNTER — Other Ambulatory Visit: Payer: Self-pay | Admitting: Internal Medicine

## 2019-09-04 DIAGNOSIS — F32A Depression, unspecified: Secondary | ICD-10-CM

## 2019-09-05 ENCOUNTER — Encounter: Payer: Self-pay | Admitting: Nurse Practitioner

## 2019-09-09 ENCOUNTER — Encounter: Payer: Self-pay | Admitting: Nurse Practitioner

## 2019-09-09 ENCOUNTER — Ambulatory Visit (INDEPENDENT_AMBULATORY_CARE_PROVIDER_SITE_OTHER): Payer: 59 | Admitting: Nurse Practitioner

## 2019-09-09 ENCOUNTER — Ambulatory Visit: Payer: 59 | Admitting: Nurse Practitioner

## 2019-09-09 ENCOUNTER — Other Ambulatory Visit: Payer: Self-pay

## 2019-09-09 VITALS — BP 128/80 | HR 102 | Temp 98.5°F | Ht 66.8 in | Wt 298.0 lb

## 2019-09-09 DIAGNOSIS — Z6841 Body Mass Index (BMI) 40.0 and over, adult: Secondary | ICD-10-CM | POA: Diagnosis not present

## 2019-09-09 DIAGNOSIS — F329 Major depressive disorder, single episode, unspecified: Secondary | ICD-10-CM | POA: Diagnosis not present

## 2019-09-09 DIAGNOSIS — F32A Depression, unspecified: Secondary | ICD-10-CM

## 2019-09-09 MED ORDER — BUPROPION HCL ER (SR) 150 MG PO TB12: 150.0000 mg | ORAL_TABLET | Freq: Two times a day (BID) | ORAL | 1 refills | Status: DC

## 2019-09-09 MED ORDER — WEGOVY 1.7 MG/0.75ML ~~LOC~~ SOAJ: 1.7000 mg | SUBCUTANEOUS | 0 refills | Status: DC

## 2019-09-09 NOTE — Patient Instructions (Signed)

## 2019-09-09 NOTE — Progress Notes (Signed)
I,Yamilka [employer] as a Education administrator for [employer], FNP.,have documented all relevant documentation on the behalf of [name], FNP,as directed by  [name], FNP while in the presence of [name], [place].   This visit occurred during the SARS-CoV-2 public health emergency.  Safety protocols were in place, including screening questions prior to the visit, additional usage of staff PPE, and extensive cleaning of exam room while observing appropriate contact time as indicated for disinfecting solutions.  Subjective:     Patient ID: Rhonda Murray , female    DOB: 10-11-1975 , 44 y.o.   MRN: [id number]   Chief Complaint  Patient presents with  . Weight Check    HPI  Wt Readings from Last 3 Encounters: 09/09/19 : 298 lb (135.2 kg) 08/12/19 : (!) 302 lb 9.6 oz (137.3 kg) 07/15/19 : (!) 308 lb 3.2 oz (139.8 kg)  She does admit to being stressed and will eat more carbs.  She will drink sodas.    She did well with her covid vaccine other than getting a headache on the following Monday. Not too bad.  She gets full fast and feels it is suppressing her appetite. She is not snacking as much.  She is using her CPAP more regularly.      Past Medical History:  Diagnosis Date  . Anxiety unknown  . COVID-19 virus infection 09/2019  . Morbid obesity with BMI of 45.0-49.9, adult ([place])   . Sleep apnea      Family History  Problem Relation Age of Onset  . Stroke Mother   . Hypertension Mother     Current Outpatient Medications:  .  buPROPion (WELLBUTRIN SR) 150 MG 12 hr tablet, Take 1 tablet (150 mg total) by mouth 2 (two) times daily., Disp: 180 tablet, Rfl: 1 .  ibuprofen (ADVIL) 800 MG tablet, Take 1 tablet (800 mg total) by mouth every 8 (eight) hours as needed. (Patient taking differently: Take 800 mg by mouth every 8 (eight) hours as needed for moderate pain. ), Disp: 30 tablet, Rfl: 0 .  Insulin Pen Needle 32G X 6 MM MISC, 1 each by Does not apply route daily., Disp: 100  each, Rfl: 1 .  metFORMIN (GLUCOPHAGE) 500 MG tablet, Take 1 tablet (500 mg total) by mouth 2 (two) times daily with a meal., Disp: 180 tablet, Rfl: 1 .  albuterol (VENTOLIN HFA) 108 (90 Base) MCG/ACT inhaler, Inhale 2 puffs into the lungs every 6 (six) hours as needed for wheezing or shortness of breath., Disp: 8 g, Rfl: 2 .  NOREL AD 4-10-325 MG TABS, Take 1 tablet by mouth 2 (two) times daily as needed., Disp: 84 tablet, Rfl: 2 .  Semaglutide-Weight Management (WEGOVY) 1.7 MG/0.75ML SOAJ, Inject 0.75 mLs (1.7 mg total) into the skin once a week., Disp: 3 mL, Rfl: 0   No Known Allergies   Review of Systems  Constitutional: Negative.   Respiratory: Negative.   Cardiovascular: Negative.  Negative for chest pain, palpitations and leg swelling.  Gastrointestinal: Negative.   Musculoskeletal: Negative.   Psychiatric/Behavioral: Negative.      Today's Vitals   09/09/19 1523  BP: 128/80  Pulse: (!) 102  Temp: 98.5 F (36.9 C)  TempSrc: Oral  Weight: 298 lb (135.2 kg)  Height: 5' 6.8" (1.697 m)  PainSc: 3   PainLoc: Ankle   Body mass index is 46.95 kg/m.   Objective:  Physical Exam Constitutional:      General: She is not in acute distress.  Appearance: Normal appearance.  Cardiovascular:     Rate and Rhythm: Normal rate and regular rhythm.     Pulses: Normal pulses.     Heart sounds: Normal heart sounds. No murmur heard.   Pulmonary:     Effort: Pulmonary effort is normal. No respiratory distress.     Breath sounds: Normal breath sounds.  Skin:    Capillary Refill: Capillary refill takes less than 2 seconds.  Neurological:     General: No focal deficit present.     Mental Status: She is alert and oriented to person, place, and time.     Cranial Nerves: No cranial nerve deficit.  Psychiatric:        Mood and Affect: Mood normal.        Behavior: Behavior normal.        Thought Content: Thought content normal.        Judgment: Judgment normal.         Assessment  And Plan:     1. Class 3 severe obesity due to excess calories without serious comorbidity with body mass index (BMI) of 45.0 to 49.9 in adult [facility])  She has lost 3 lbs since her last visit and feels she is doing well  Will increase Wegovy to 1.7 mg - Semaglutide-Weight Management (WEGOVY) 1.7 MG/0.75ML SOAJ; Inject 0.75 mLs (1.7 mg total) into the skin once a week.  Dispense: 3 mL; Refill: 0  2. Depression, unspecified depression type  She is doing well with bupropion and feels this is beneficial - buPROPion (WELLBUTRIN SR) 150 MG 12 hr tablet; Take 1 tablet (150 mg total) by mouth 2 (two) times daily.  Dispense: 180 tablet; Refill: 1     Patient was given opportunity to ask questions. Patient verbalized understanding of the plan and was able to repeat key elements of the plan. All questions were answered to their satisfaction.   Teola Bradley, FNP, have reviewed all documentation for this visit. The documentation on 10/05/19 for the exam, diagnosis, procedures, and orders are all accurate and complete.  THE PATIENT IS ENCOURAGED TO PRACTICE SOCIAL DISTANCING DUE TO THE COVID-19 PANDEMIC.

## 2019-09-10 DIAGNOSIS — U071 COVID-19: Secondary | ICD-10-CM

## 2019-09-10 HISTORY — DX: COVID-19: U07.1

## 2019-09-17 ENCOUNTER — Encounter: Payer: Self-pay | Admitting: Nurse Practitioner

## 2019-09-18 ENCOUNTER — Other Ambulatory Visit: Payer: Self-pay

## 2019-09-18 ENCOUNTER — Emergency Department (HOSPITAL_COMMUNITY): Payer: 59

## 2019-09-18 ENCOUNTER — Encounter (HOSPITAL_COMMUNITY): Payer: Self-pay

## 2019-09-18 ENCOUNTER — Emergency Department (HOSPITAL_COMMUNITY)
Admission: EM | Admit: 2019-09-18 | Discharge: 2019-09-19 | Disposition: A | Discharge status: Home / Self Care | Payer: 59 | Attending: Emergency Medicine | Admitting: Emergency Medicine

## 2019-09-18 DIAGNOSIS — Z87891 Personal history of nicotine dependence: Secondary | ICD-10-CM | POA: Insufficient documentation

## 2019-09-18 DIAGNOSIS — U071 COVID-19: Secondary | ICD-10-CM

## 2019-09-18 DIAGNOSIS — R000 Tachycardia, unspecified: Secondary | ICD-10-CM | POA: Diagnosis not present

## 2019-09-18 DIAGNOSIS — R059 Cough, unspecified: Secondary | ICD-10-CM

## 2019-09-18 DIAGNOSIS — R079 Chest pain, unspecified: Secondary | ICD-10-CM | POA: Diagnosis present

## 2019-09-18 LAB — COMPREHENSIVE METABOLIC PANEL
ALT: 24 U/L (ref 0–44)
AST: 20 U/L (ref 15–41)
Albumin: 4.1 g/dL (ref 3.5–5.0)
Alkaline Phosphatase: 84 U/L (ref 38–126)
Anion gap: 10 (ref 5–15)
BUN: 13 mg/dL (ref 6–20)
CO2: 26 mmol/L (ref 22–32)
Calcium: 9.5 mg/dL (ref 8.9–10.3)
Chloride: 102 mmol/L (ref 98–111)
Creatinine, Ser: 1.07 mg/dL — ABNORMAL HIGH (ref 0.44–1.00)
GFR calc Af Amer: >60 mL/min (ref >60)
GFR calc non Af Amer: >60 mL/min (ref >60)
Glucose, Bld: 95 mg/dL (ref 70–99)
Potassium: 3.7 mmol/L (ref 3.5–5.1)
Sodium: 138 mmol/L (ref 135–145)
Total Bilirubin: 0.5 mg/dL (ref 0.3–1.2)
Total Protein: 8.4 g/dL — ABNORMAL HIGH (ref 6.5–8.1)

## 2019-09-18 LAB — CBC WITH DIFFERENTIAL/PLATELET
Abs Immature Granulocytes: 0.01 10*3/uL (ref 0.00–0.07)
Basophils Absolute: 0 10*3/uL (ref 0.0–0.1)
Basophils Relative: 0 %
Eosinophils Absolute: 0.1 10*3/uL (ref 0.0–0.5)
Eosinophils Relative: 1 %
HCT: 43.7 % (ref 36.0–46.0)
Hemoglobin: 14.5 g/dL (ref 12.0–15.0)
Immature Granulocytes: 0 %
Lymphocytes Relative: 37 %
Lymphs Abs: 2.6 10*3/uL (ref 0.7–4.0)
MCH: 30.7 pg (ref 26.0–34.0)
MCHC: 33.2 g/dL (ref 30.0–36.0)
MCV: 92.4 fL (ref 80.0–100.0)
Monocytes Absolute: 0.5 10*3/uL (ref 0.1–1.0)
Monocytes Relative: 7 %
Neutro Abs: 3.9 10*3/uL (ref 1.7–7.7)
Neutrophils Relative %: 55 %
Platelets: 358 10*3/uL (ref 150–400)
RBC: 4.73 MIL/uL (ref 3.87–5.11)
RDW: 13.3 % (ref 11.5–15.5)
WBC: 7.2 10*3/uL (ref 4.0–10.5)
nRBC: 0 % (ref 0.0–0.2)

## 2019-09-18 LAB — TROPONIN I (HIGH SENSITIVITY): Troponin I (High Sensitivity): <2 ng/L (ref <18)

## 2019-09-18 MED ORDER — LORAZEPAM 1 MG PO TABS
1.0000 mg | ORAL_TABLET | Freq: Once | ORAL | Status: AC
  Administered 2019-09-18: 1 mg via ORAL
  Filled 2019-09-18: qty 1

## 2019-09-18 MED FILL — WEGOVY 1.7 MG/0.75ML SOAJ: 1.7 | 28 days supply | Qty: 3 | Fill #0

## 2019-09-18 NOTE — ED Provider Notes (Signed)
[facility] DEPT Provider Note   CSN: [id number] Arrival date & time: 09/18/19  1630     History Chief Complaint  Patient presents with  . Shortness of Breath    Rhonda Murray is a 44 y.o. female.  HPI   Patient is a 44 year old female with a history of obesity, sleep apnea, anxiety, depression, who presents to the emergency department with a multitude of symptoms consistent with COVID-19 infection.  Patient states that her symptoms started about 5 days ago after her son was recently diagnosed with COVID-19.  She states that she tested positive for COVID-19 yesterday.  She reports intermittent diffuse headaches, rhinorrhea, fatigue, body aches, worsening shortness of breath.  Patient states yesterday she started experiencing 2-3 out of 10 dull/aching central constant chest pain.  Worst when lying flat.  Mildly alleviated when sitting up.  She has been taking Tylenol and naproxen for management of her symptoms.  Patient has received 1 dose of the Moderna vaccine on August 20.  She is due for her second dose.  She denies fevers, chills, abdominal pain, nausea, vomiting, diarrhea, syncope.     Past Medical History:  Diagnosis Date  . Anxiety unknown  . Medical history non-contributory   . Sleep apnea     Patient Active Problem List   Diagnosis Date Noted  . Depression 04/07/2018  . Right lower quadrant abdominal pain 03/18/2018  . Nocturia 03/18/2018  . Morbid obesity with BMI of 45.0-49.9, adult ([place]) 03/18/2018  . Tingling of skin 03/18/2018  . History of cesarean section, unknown scar 11/09/2014  . Hypokalemia 12/26/2012    Past Surgical History:  Procedure Laterality Date  . CESAREAN SECTION N/A 11/09/2014   Procedure: CESAREAN SECTION;  Surgeon: Paula Compton, MD;  Location: [facility];  Service: Obstetrics;  Laterality: N/A;  . LEEP    . MASS EXCISION N/A 04/24/2019   Procedure: EXCISION OF ABDOMINAL WALL MASS;  Surgeon: Erroll Luna, MD;   Location: [facility];  Service: General;  Laterality: N/A;     OB History    Gravida  3   Para  2   Term  1   Preterm  1   AB  1   Living  2     SAB  1   TAB      Ectopic      Multiple  0   Live Births  2           Family History  Problem Relation Age of Onset  . Stroke Mother   . Hypertension Mother     Social History   Tobacco Use  . Smoking status: Former Research scientist (life sciences)  . Smokeless tobacco: Never Used  Vaping Use  . Vaping Use: Never used  Substance Use Topics  . Alcohol use: No  . Drug use: No    Home Medications Prior to Admission medications   Medication Sig Start Date End Date Taking? Authorizing Provider  buPROPion (WELLBUTRIN SR) 150 MG 12 hr tablet Take 1 tablet (150 mg total) by mouth 2 (two) times daily. 09/09/19   [name], FNP  ibuprofen (ADVIL) 800 MG tablet Take 1 tablet (800 mg total) by mouth every 8 (eight) hours as needed. 04/24/19   Cornett, [name], MD  Insulin Pen Needle 32G X 6 MM MISC 1 each by Does not apply route daily. 06/30/19   [name], FNP  metFORMIN (GLUCOPHAGE) 500 MG tablet Take 1 tablet (500 mg total) by mouth 2 (two) times  daily with a meal. 05/05/19 05/04/20  [name], FNP  Semaglutide-Weight Management (WEGOVY) 1.7 MG/0.75ML SOAJ Inject 0.75 mLs (1.7 mg total) into the skin once a week. 09/09/19   [name], FNP    Allergies    Patient has no known allergies.  Review of Systems   Review of Systems  All other systems reviewed and are negative. Ten systems reviewed and are negative for acute change, except as noted in the HPI.   Physical Exam Updated Vital Signs BP (!) 131/105 (BP Location: Left Arm)   Pulse (!) 102   Temp 99 F (37.2 C) (Oral)   Resp 18   Ht 5\' 7"  (1.702 m)   Wt 135.2 kg   SpO2 98%   BMI 46.67 kg/m   Physical Exam Vitals and nursing note reviewed.  Constitutional:      General: She is not in acute distress.    Appearance: Normal appearance. She is  well-developed. She is obese. She is not ill-appearing, toxic-appearing or diaphoretic.  HENT:     Head: Normocephalic and atraumatic.     Right Ear: External ear normal.     Left Ear: External ear normal.     Nose: Nose normal.     Mouth/Throat:     Mouth: Mucous membranes are moist.     Pharynx: Oropharynx is clear. No oropharyngeal exudate or posterior oropharyngeal erythema.  Eyes:     Extraocular Movements: Extraocular movements intact.  Cardiovascular:     Rate and Rhythm: Regular rhythm. Tachycardia present.     Pulses: Normal pulses.     Heart sounds: Normal heart sounds. No murmur heard.  No friction rub. No gallop.      Comments: No murmurs, rubs, gallops.  Patient is tachycardic around 105 on my exam. Pulmonary:     Effort: Pulmonary effort is normal. No tachypnea, bradypnea, accessory muscle usage or respiratory distress.     Breath sounds: Normal breath sounds. No stridor. No decreased breath sounds, wheezing, rhonchi or rales.     Comments: Lungs are clear to auscultation bilaterally.  Chest x-ray negative.  Oxygen saturations around 95 to 98% while sitting.  Oxygen saturations drop as low as 82% while standing and ambulating in place.  No tenderness noted with palpation of the anterior chest wall. Abdominal:     General: Abdomen is flat.     Tenderness: There is no abdominal tenderness.  Musculoskeletal:        General: Normal range of motion.     Cervical back: Normal range of motion and neck supple. No tenderness.     Right lower leg: No tenderness. No edema.     Left lower leg: No tenderness. No edema.  Skin:    General: Skin is warm and dry.  Neurological:     General: No focal deficit present.     Mental Status: She is alert and oriented to person, place, and time.  Psychiatric:        Mood and Affect: Mood normal.        Behavior: Behavior normal.    ED Results / Procedures / Treatments   Labs (all labs ordered are listed, but only abnormal results are  displayed) Labs Reviewed  CBC WITH DIFFERENTIAL/PLATELET  COMPREHENSIVE METABOLIC PANEL  I-STAT BETA HCG BLOOD, ED ([facility], [facility], [facility] ONLY)  TROPONIN I (HIGH SENSITIVITY)   EKG None  Radiology DG Chest 2 View  Result Date: 09/18/2019 CLINICAL DATA:  COVID-19 diagnosis yesterday, chest pressure, dry cough EXAM: CHEST -  2 VIEW COMPARISON:  09/27/2007 FINDINGS: The heart size and mediastinal contours are within normal limits. Both lungs are clear. The visualized skeletal structures are unremarkable. IMPRESSION: No active cardiopulmonary disease. Electronically Signed   By: Randa Ngo M.D.   On: 09/18/2019 18:13   Procedures Procedures   Medications Ordered in ED Medications  LORazepam (ATIVAN) tablet 1 mg (has no administration in time range)   ED Course  I have reviewed the triage vital signs and the nursing notes.  Pertinent labs & imaging results that were available during my care of the patient were reviewed by me and considered in my medical decision making (see chart for details).  Clinical Course as of Sep 18 2123  Thu Sep 18, 2019  2057 Pulse Rate(!): 102 [LJ]  2057 Negative  DG Chest 2 View [LJ]  2105 As of yesterday patient is positive for COVID-19.  She has been mildly tachycardic throughout her stay today.  Saturations close to 98% on room air while sitting.  Her saturations will periodically drop to the mid 80s while standing.  She does endorse mild shortness of breath.  Will obtain basic labs as well as a troponin.  Discussed the possibility of a CT PE study and patient notes significant claustrophobia and states that she does not/will not have this test performed.   [LJ]  2124 Reassessed patient and she once again became hypoxic to 82% while standing and talking to the nurse technician.  Discussed PE study again with the patient.  She is very nervous regarding this but offered a dose of Ativan.  She states that she will take the Ativan and will have the CT scan performed.    [LJ]    Clinical Course User Index [LJ] Rayna Sexton, PA-C   MDM Rules/Calculators/A&P                          Pt is a 44 y.o. female that presents with a history, physical exam, and ED Clinical Course as noted above.   Patient presents today with multiple symptoms consistent with COVID-19.  Patient tested positive for COVID-19 yesterday.  Over the past 24 hours she states she is experiencing constant dull/aching central chest pain as well as increased dyspnea on exertion.  Patient's saturations remained in the high 90s on room air while sitting and conversing with me but when standing and ambulating in place have dropped to 82% on 2 different occasions.  Given her symptoms, I have obtained basic labs as well as troponin.  We will also obtain a CT of the chest to rule out pulmonary embolism.  Patient has a history of anxiety as well as claustrophobia and is concerned regarding the CT scan.  She was given a milligram of Ativan and she is amenable with obtaining the CT scan.  Based on results of imaging and labs believe that admission could be warranted based on patient's symptoms and hypoxia.  Discussed this with the patient and she is amenable, if necessary.  It is the end of my shift and patient care will be transferred to [facility].  Note: Portions of this report may have been transcribed using voice recognition software. Every effort was made to ensure accuracy; however, inadvertent computerized transcription errors may be present.   Final Clinical Impression(s) / ED Diagnoses Final diagnoses:  Cough  COVID-19  Hypoxia   Rx / DC Orders ED Discharge Orders    None  Rayna Sexton, PA-C 09/18/19 2217    Malvin Johns, MD 09/18/19 [phone]

## 2019-09-18 NOTE — ED Provider Notes (Signed)
+  COVID CP, worse when lying back, better sitting up EKG ok, tachycardic 82-85% with simple standing but normal O2 saturation when sitting, or speaking.  Pending CTA Labs pending  Labs unremarkable. CTA negative for PE.   On re-exam: she is well appearing. In NAD, no hypoxia, at rest or on ambulation.   IV Decadron provided. Will refer to the Infusion clinic for MAB. All questions answered. She is felt stable for discharge home. Return precautions discussed.      Charlann Lange, PA-C 09/19/19 0327    Malvin Johns, MD 09/24/19 [phone]

## 2019-09-18 NOTE — ED Triage Notes (Signed)
Patient states she was diagnosed with Covid-19 yesterday. Patient states she has tightness and pressure when she takes a deep breath.

## 2019-09-18 NOTE — ED Notes (Signed)
Multiple unsuccessful IV attempts by ER staff.

## 2019-09-19 ENCOUNTER — Telehealth: Payer: Self-pay | Admitting: Nurse Practitioner

## 2019-09-19 ENCOUNTER — Emergency Department (HOSPITAL_COMMUNITY): Payer: 59

## 2019-09-19 LAB — I-STAT BETA HCG BLOOD, ED (MC, WL, AP ONLY): I-stat hCG, quantitative: <5 m[IU]/mL (ref <5)

## 2019-09-19 LAB — TROPONIN I (HIGH SENSITIVITY): Troponin I (High Sensitivity): <2 ng/L (ref <18)

## 2019-09-19 MED ORDER — DEXAMETHASONE SODIUM PHOSPHATE 10 MG/ML IJ SOLN
10.0000 mg | Freq: Once | INTRAMUSCULAR | Status: AC
  Administered 2019-09-19: 10 mg via INTRAVENOUS
  Filled 2019-09-19: qty 1

## 2019-09-19 MED ORDER — IOHEXOL 350 MG/ML SOLN
100.0000 mL | Freq: Once | INTRAVENOUS | Status: AC | PRN
  Administered 2019-09-19: 100 mL via INTRAVENOUS

## 2019-09-19 NOTE — Discharge Instructions (Addendum)
Getting the antibody treatment for COVID is recommended. The [facility] will contact you regarding an appointment.   Treat any fever with tylenol. Push fluids. Return to the emergency department with any new or worsening symptoms.

## 2019-09-19 NOTE — ED Notes (Signed)
IV team unable to gain IV access, Dr. Betsey Holiday at bedside to attempt [place] IV.

## 2019-09-19 NOTE — Telephone Encounter (Signed)
Called patient to discuss COVID symptoms and the use of casirivimab/imdevimab, a monoclonal antibody infusion for those with mild to moderate Covid symptoms and at a high risk of hospitalization.  Pt is qualified for this infusion at the [facility] due to; Specific high risk criteria : BMI > 25   Message left to call back our hotline [phone].  [name], NP

## 2019-09-19 NOTE — ED Notes (Signed)
PT in CT, CT to bring pt to room 22 when CT complete

## 2019-09-19 NOTE — ED Provider Notes (Signed)
Angiocath insertion Performed by: Orpah Greek  Consent: Verbal consent obtained. Risks and benefits: risks, benefits and alternatives were discussed Time out: Immediately prior to procedure a "time out" was called to verify the correct patient, procedure, equipment, support staff and site/side marked as required.  Preparation: Patient was prepped and draped in the usual sterile fashion.  Vein Location: right antecub  Ultrasound Guided  Gauge: 20  Normal blood return and flush without difficulty Patient tolerance: Patient tolerated the procedure well with no immediate complications.      Orpah Greek, MD 09/19/19 [phone]

## 2019-09-19 NOTE — ED Notes (Signed)
Pt ambulated in hallways approximately 40 feet. Oxygen saturation maintained 99% on room air. Heart rate stayed stable around 105-110bpm.

## 2019-09-20 ENCOUNTER — Ambulatory Visit (HOSPITAL_COMMUNITY)
Admission: RE | Admit: 2019-09-20 | Discharge: 2019-09-20 | Disposition: A | Discharge status: Home / Self Care | Payer: 59 | Source: Ambulatory Visit | Attending: Pulmonary Disease | Admitting: Pulmonary Disease

## 2019-09-20 ENCOUNTER — Other Ambulatory Visit: Payer: Self-pay | Admitting: Nurse Practitioner

## 2019-09-20 ENCOUNTER — Encounter: Payer: Self-pay | Admitting: Nurse Practitioner

## 2019-09-20 ENCOUNTER — Telehealth: Payer: Self-pay | Admitting: Nurse Practitioner

## 2019-09-20 DIAGNOSIS — U071 COVID-19: Secondary | ICD-10-CM | POA: Diagnosis present

## 2019-09-20 DIAGNOSIS — Z6841 Body Mass Index (BMI) 40.0 and over, adult: Secondary | ICD-10-CM | POA: Insufficient documentation

## 2019-09-20 MED ORDER — ACETAMINOPHEN 325 MG PO TABS
650.0000 mg | ORAL_TABLET | Freq: Four times a day (QID) | ORAL | Status: DC | PRN
  Administered 2019-09-20: 650 mg via ORAL
  Filled 2019-09-20: qty 2

## 2019-09-20 MED ORDER — SODIUM CHLORIDE 0.9 % IV SOLN: INTRAVENOUS | Status: DC | PRN

## 2019-09-20 MED ORDER — ALBUTEROL SULFATE HFA 108 (90 BASE) MCG/ACT IN AERS: 2.0000 | INHALATION_SPRAY | Freq: Once | RESPIRATORY_TRACT | Status: DC | PRN

## 2019-09-20 MED ORDER — METHYLPREDNISOLONE SODIUM SUCC 125 MG IJ SOLR: 125.0000 mg | Freq: Once | INTRAMUSCULAR | Status: DC | PRN

## 2019-09-20 MED ORDER — SODIUM CHLORIDE 0.9 % IV SOLN
1200.0000 mg | Freq: Once | INTRAVENOUS | Status: AC
  Administered 2019-09-20: 1200 mg via INTRAVENOUS
  Filled 2019-09-20: qty 10

## 2019-09-20 MED ORDER — DIPHENHYDRAMINE HCL 50 MG/ML IJ SOLN: 50.0000 mg | Freq: Once | INTRAMUSCULAR | Status: DC | PRN

## 2019-09-20 MED ORDER — FAMOTIDINE IN NACL 20-0.9 MG/50ML-% IV SOLN: 20.0000 mg | Freq: Once | INTRAVENOUS | Status: DC | PRN

## 2019-09-20 MED ORDER — EPINEPHRINE 0.3 MG/0.3ML IJ SOAJ: 0.3000 mg | Freq: Once | INTRAMUSCULAR | Status: DC | PRN

## 2019-09-20 NOTE — Progress Notes (Addendum)
Returned to pt room. I let the pt know that the provider on call recommended she consider receiving the remaining medication. Pt raised her voice, leaned forward in her seat, and said loudly, "She didn't feel the pain I feel." I said okay and left the room.

## 2019-09-20 NOTE — Progress Notes (Signed)
Returned to pt room. Pt was watching videos on her phone and laughing. I let her know the provider provider on call recommended ativan if she did not drive. Pt said she did not need ativan.

## 2019-09-20 NOTE — Progress Notes (Signed)
Started casirivimab/imdevimab (REGEN-COV) infusion and 11 mL infused. Pt said her lower, right back starting hurting. She said it was caused by infusion, and her back was too painful to receive the remaining infusion. We stopped the infusion. 99 mL did not infuse. Her vitals signs were stable and documented in flowsheets. Her heart rate increased to 120. I messaged the provider on call.

## 2019-09-20 NOTE — Telephone Encounter (Signed)
I connected by phone with Rhonda Murray on 09/20/2019 at 8:21 AM to discuss the potential use of a new treatment for mild to moderate COVID-19 viral infection in non-hospitalized patients.  This patient is a 44 y.o. female that meets the FDA criteria for Emergency Use Authorization of COVID monoclonal antibody casirivimab/imdevimab.  Has a (+) direct SARS-CoV-2 viral test result  Has mild or moderate COVID-19   Is NOT hospitalized due to COVID-19  Is within 10 days of symptom onset  Has at least one of the high risk factor(s) for progression to severe COVID-19 and/or hospitalization as defined in EUA.  Specific high risk criteria : BMI > 25   I have spoken and communicated the following to the patient or parent/caregiver regarding COVID monoclonal antibody treatment:  1. FDA has authorized the emergency use for the treatment of mild to moderate COVID-19 in adults and pediatric patients with positive results of direct SARS-CoV-2 viral testing who are 97 years of age and older weighing at least 40 kg, and who are at high risk for progressing to severe COVID-19 and/or hospitalization.  2. The significant known and potential risks and benefits of COVID monoclonal antibody, and the extent to which such potential risks and benefits are unknown.  3. Information on available alternative treatments and the risks and benefits of those alternatives, including clinical trials.  4. Patients treated with COVID monoclonal antibody should continue to self-isolate and use infection control measures (e.g., wear mask, isolate, social distance, avoid sharing personal items, clean and disinfect "high touch" surfaces, and frequent handwashing) according to CDC guidelines.   5. The patient or parent/caregiver has the option to accept or refuse COVID monoclonal antibody treatment.  After reviewing this information with the patient, The patient agreed to proceed with receiving casirivimab\imdevimab infusion and  will be provided a copy of the Fact sheet prior to receiving the infusion.  [name], NP 09/20/2019 8:21 AM

## 2019-09-20 NOTE — Progress Notes (Signed)
I connected by phone with Rhonda Murray on 09/20/2019 at 8:21 AM to discuss the potential use of a new treatment for mild to moderate COVID-19 viral infection in non-hospitalized patients.  This patient is a 44 y.o. female that meets the FDA criteria for Emergency Use Authorization of COVID monoclonal antibody casirivimab/imdevimab.  Has a (+) direct SARS-CoV-2 viral test result  Has mild or moderate COVID-19   Is NOT hospitalized due to COVID-19  Is within 10 days of symptom onset  Has at least one of the high risk factor(s) for progression to severe COVID-19 and/or hospitalization as defined in EUA.  Specific high risk criteria : BMI > 25   I have spoken and communicated the following to the patient or parent/caregiver regarding COVID monoclonal antibody treatment:  1. FDA has authorized the emergency use for the treatment of mild to moderate COVID-19 in adults and pediatric patients with positive results of direct SARS-CoV-2 viral testing who are 9 years of age and older weighing at least 40 kg, and who are at high risk for progressing to severe COVID-19 and/or hospitalization.  2. The significant known and potential risks and benefits of COVID monoclonal antibody, and the extent to which such potential risks and benefits are unknown.  3. Information on available alternative treatments and the risks and benefits of those alternatives, including clinical trials.  4. Patients treated with COVID monoclonal antibody should continue to self-isolate and use infection control measures (e.g., wear mask, isolate, social distance, avoid sharing personal items, clean and disinfect "high touch" surfaces, and frequent handwashing) according to CDC guidelines.   5. The patient or parent/caregiver has the option to accept or refuse COVID monoclonal antibody treatment.  After reviewing this information with the patient, The patient agreed to proceed with receiving casirivimab\imdevimab infusion and  will be provided a copy of the Fact sheet prior to receiving the infusion.  [name], NP 09/20/2019 8:21 AM

## 2019-09-22 ENCOUNTER — Encounter: Payer: Self-pay | Admitting: Nurse Practitioner

## 2019-09-25 ENCOUNTER — Telehealth (INDEPENDENT_AMBULATORY_CARE_PROVIDER_SITE_OTHER): Payer: 59 | Admitting: Nurse Practitioner

## 2019-09-25 ENCOUNTER — Encounter: Payer: Self-pay | Admitting: Nurse Practitioner

## 2019-09-25 ENCOUNTER — Other Ambulatory Visit: Payer: Self-pay

## 2019-09-25 VITALS — Wt 298.0 lb

## 2019-09-25 DIAGNOSIS — U071 COVID-19: Secondary | ICD-10-CM

## 2019-09-25 DIAGNOSIS — R0602 Shortness of breath: Secondary | ICD-10-CM | POA: Diagnosis not present

## 2019-09-25 MED ORDER — NOREL AD 4-10-325 MG PO TABS: 1.0000 | ORAL_TABLET | Freq: Two times a day (BID) | ORAL | 2 refills | Status: DC | PRN

## 2019-09-25 MED ORDER — ALBUTEROL SULFATE HFA 108 (90 BASE) MCG/ACT IN AERS: 2.0000 | INHALATION_SPRAY | Freq: Four times a day (QID) | RESPIRATORY_TRACT | 2 refills | Status: DC | PRN

## 2019-09-25 MED ORDER — ALBUTEROL SULFATE (2.5 MG/3ML) 0.083% IN NEBU: 2.5000 mg | INHALATION_SOLUTION | RESPIRATORY_TRACT | 2 refills | Status: DC | PRN

## 2019-09-25 NOTE — Progress Notes (Signed)
Virtual Visit via MyChart   This visit type was conducted due to national recommendations for restrictions regarding the COVID-19 Pandemic (e.g. social distancing) in an effort to limit this patient's exposure and mitigate transmission in our community.  Due to her co-morbid illnesses, this patient is at least at moderate risk for complications without adequate follow2 up.  This format is felt to be most appropriate for this patient at this time.  All issues noted in this document were discussed and addressed.  A limited physical exam was performed with this format.    This visit type was conducted due to national recommendations for restrictions regarding the COVID-19 Pandemic (e.g. social distancing) in an effort to limit this patient's exposure and mitigate transmission in our community.  Patients identity confirmed using two different identifiers.  This format is felt to be most appropriate for this patient at this time.  All issues noted in this document were discussed and addressed.  No physical exam was performed (except for noted visual exam findings with Video Visits).    Date:  10/03/2019   ID:  Rhonda Murray, DOB 01/07/1976, MRN [id number]  Patient Location:  Home - spoke with Rhonda Murray  Provider location:   Office    Chief Complaint:  Positive for covid  History of Present Illness:    Rhonda Murray is a 44 y.o. female who presents via video conferencing for a telehealth visit today.    The patient does have symptoms concerning for COVID-19 infection (fever, chills, cough, or new shortness of breath).   Virtual visit due to patient.  Her oxygen level was low, had the monoclonal antibody infusion.  When she first started the infusion she had took a drink and she began having chest discomfort and feeling like sharp pain in her back. She was also having shortness of breath.  She was also having anxiety so she watched a video to help her to laugh. She stopped the infusion  before the completion of the infusion. She was offered ativan but had drove her to the clinic.  She had lost her taste and smell she was diagnosed.  Her son had covid from school.  Symptoms stared on 9/5, she was tired her body ached and cough. Then on 9/6 lost taste and smell, she also had chest pain and could not catch her breath mostly at night.  She did go to the ER was given a steroid vaccine was able to get up by the weekend. Continues to have aches in her legs.  She had the Moderna vaccine on 8/20.      Past Medical History:  Diagnosis Date  . Anxiety unknown  . COVID-19 virus infection 09/2019  . Morbid obesity with BMI of 45.0-49.9, adult ([place])   . Sleep apnea    Past Surgical History:  Procedure Laterality Date  . CESAREAN SECTION N/A 11/09/2014   Procedure: CESAREAN SECTION;  Surgeon: Paula Compton, MD;  Location: [facility];  Service: Obstetrics;  Laterality: N/A;  . LEEP    . MASS EXCISION N/A 04/24/2019   Procedure: EXCISION OF ABDOMINAL WALL MASS;  Surgeon: Erroll Luna, MD;  Location: [facility];  Service: General;  Laterality: N/A;     Current Meds  Medication Sig  . buPROPion (WELLBUTRIN SR) 150 MG 12 hr tablet Take 1 tablet (150 mg total) by mouth 2 (two) times daily.  Marland Kitchen ibuprofen (ADVIL) 800 MG tablet Take 1 tablet (800 mg total) by mouth every 8 (eight) hours as  needed. (Patient taking differently: Take 800 mg by mouth every 8 (eight) hours as needed for moderate pain. )  . Insulin Pen Needle 32G X 6 MM MISC 1 each by Does not apply route daily.  . metFORMIN (GLUCOPHAGE) 500 MG tablet Take 1 tablet (500 mg total) by mouth 2 (two) times daily with a meal.  . Semaglutide-Weight Management (WEGOVY) 1.7 MG/0.75ML SOAJ Inject 0.75 mLs (1.7 mg total) into the skin once a week.     Allergies:   Patient has no known allergies.   Social History   Tobacco Use  . Smoking status: Former Research scientist (life sciences)  . Smokeless tobacco: Never Used  Vaping Use  . Vaping Use:  Never used  Substance Use Topics  . Alcohol use: No  . Drug use: No     Family Hx: The patient's family history includes Hypertension in her mother; Stroke in her mother.  ROS:   Please see the history of present illness.    Review of Systems  Constitutional: Positive for malaise/fatigue.  Respiratory: Positive for cough and shortness of breath. Negative for wheezing.   Cardiovascular: Negative.   Neurological: Negative for dizziness and tingling.  Psychiatric/Behavioral: Negative.     All other systems reviewed and are negative.   Labs/Other Tests and Data Reviewed:    Recent Labs: 04/28/2019: TSH 1.370 09/18/2019: ALT 24; BUN 13; Creatinine, Ser 1.07; Hemoglobin 14.5; Platelets 358; Potassium 3.7; Sodium 138   Recent Lipid Panel Lab Results  Component Value Date/Time   CHOL 149 11/05/2017 04:01 PM   TRIG 73 11/05/2017 04:01 PM   HDL 47 11/05/2017 04:01 PM   CHOLHDL 3.2 11/05/2017 04:01 PM   CHOLHDL 3 12/11/2012 10:38 AM   LDLCALC 87 11/05/2017 04:01 PM    Wt Readings from Last 3 Encounters:  09/25/19 298 lb (135.2 kg)  09/18/19 298 lb (135.2 kg)  09/09/19 298 lb (135.2 kg)     Exam:    Vital Signs:  Wt 298 lb (135.2 kg)   BMI 46.67 kg/m     Physical Exam Constitutional:      General: She is not in acute distress.    Appearance: Normal appearance. She is obese.  Pulmonary:     Effort: Pulmonary effort is normal. No respiratory distress.     Comments: Every now and then when she is speaking she takes in a deep breath Neurological:     General: No focal deficit present.     Mental Status: She is alert and oriented to person, place, and time.     Cranial Nerves: No cranial nerve deficit.  Psychiatric:        Mood and Affect: Mood normal.        Behavior: Behavior normal.        Thought Content: Thought content normal.        Judgment: Judgment normal.     ASSESSMENT & PLAN:    1. Shortness of breath  Intermittent, I feel she will benefit from  working 1/2 day for one week then going to full 8 hour days without any restrictions when she returns to work on 9/20  She was also given an albuterol inhaler to use as needed - albuterol (VENTOLIN HFA) 108 (90 Base) MCG/ACT inhaler; Inhale 2 puffs into the lungs every 6 (six) hours as needed for wheezing or shortness of breath.  Dispense: 8 g; Refill: 2 - NOREL AD 4-10-325 MG TABS; Take 1 tablet by mouth 2 (two) times daily as needed.  Dispense: 84  tablet; Refill: 2  2. COVID-19 virus infection  She is on day 11 and doing much better, she did not tolerate the monoclonal antibodies  She can use the norel ad for the nasal congestion  She is aware she has to wait 90 days before receiving her second dose - NOREL AD 4-10-325 MG TABS; Take 1 tablet by mouth 2 (two) times daily as needed.  Dispense: 84 tablet; Refill: 2   COVID-19 Education: The signs and symptoms of COVID-19 were discussed with the patient and how to seek care for testing (follow up with PCP or arrange E-visit).  The importance of social distancing was discussed today.  Patient Risk:   After full review of this patients clinical status, I feel that they are at least moderate risk at this time.  Time:   Today, I have spent 19 minutes/ seconds with the patient with telehealth technology discussing above diagnoses.     Medication Adjustments/Labs and Tests Ordered: Current medicines are reviewed at length with the patient today.  Concerns regarding medicines are outlined above.   Tests Ordered: No orders of the defined types were placed in this encounter.   Medication Changes: Meds ordered this encounter  Medications  . DISCONTD: albuterol (PROVENTIL) (2.5 MG/3ML) 0.083% nebulizer solution    Sig: Take 3 mLs (2.5 mg total) by nebulization every 4 (four) hours as needed for wheezing or shortness of breath.    Dispense:  75 mL    Refill:  2  . albuterol (VENTOLIN HFA) 108 (90 Base) MCG/ACT inhaler    Sig: Inhale 2  puffs into the lungs every 6 (six) hours as needed for wheezing or shortness of breath.    Dispense:  8 g    Refill:  2  . NOREL AD 4-10-325 MG TABS    Sig: Take 1 tablet by mouth 2 (two) times daily as needed.    Dispense:  84 tablet    Refill:  2    Disposition:  Follow up prn  Signed, [name], FNP

## 2019-09-26 ENCOUNTER — Encounter: Payer: Self-pay | Admitting: Nurse Practitioner

## 2019-09-26 ENCOUNTER — Ambulatory Visit: Payer: 59

## 2019-10-03 ENCOUNTER — Encounter: Payer: Self-pay | Admitting: Nurse Practitioner

## 2019-10-08 ENCOUNTER — Ambulatory Visit: Payer: 59 | Admitting: Nurse Practitioner

## 2019-10-08 ENCOUNTER — Other Ambulatory Visit: Payer: Self-pay

## 2019-10-21 ENCOUNTER — Other Ambulatory Visit: Payer: Self-pay

## 2019-10-21 ENCOUNTER — Encounter: Payer: Self-pay | Admitting: Nurse Practitioner

## 2019-10-21 ENCOUNTER — Ambulatory Visit (INDEPENDENT_AMBULATORY_CARE_PROVIDER_SITE_OTHER): Payer: 59 | Admitting: Nurse Practitioner

## 2019-10-21 ENCOUNTER — Other Ambulatory Visit: Payer: Self-pay | Admitting: Nurse Practitioner

## 2019-10-21 VITALS — BP 120/76 | HR 102 | Temp 98.2°F | Ht 67.0 in | Wt 293.0 lb

## 2019-10-21 DIAGNOSIS — Z6841 Body Mass Index (BMI) 40.0 and over, adult: Secondary | ICD-10-CM

## 2019-10-21 MED ORDER — WEGOVY 2.4 MG/0.75ML ~~LOC~~ SOAJ: 2.4000 mg | SUBCUTANEOUS | 1 refills | Status: DC

## 2019-10-21 MED FILL — WEGOVY 2.4 MG/0.75ML SOAJ: 2.4 | 28 days supply | Qty: 3 | Fill #0

## 2019-10-21 NOTE — Patient Instructions (Signed)

## 2019-10-21 NOTE — Progress Notes (Signed)
I,Yamilka Roman [employer] as a Education administrator for [employer], FNP.,have documented all relevant documentation on the behalf of [name], FNP,as directed by  [name], FNP while in the presence of [name], [place]. This visit occurred during the SARS-CoV-2 public health emergency.  Safety protocols were in place, including screening questions prior to the visit, additional usage of staff PPE, and extensive cleaning of exam room while observing appropriate contact time as indicated for disinfecting solutions.  Subjective:     Patient ID: Rhonda Murray , female    DOB: 10/28/1975 , 44 y.o.   MRN: [id number]   Chief Complaint  Patient presents with  . Weight Check    HPI  Patient here for a weight check.  Wt Readings from Last 3 Encounters: 10/21/19 : 293 lb (132.9 kg) 09/25/19 : 298 lb (135.2 kg) 09/18/19 : 298 lb (135.2 kg)  She has been walking 30 minutes a day.  She is also using a kettle bell and doing exercises that are on Youtube.  Her diet has been good except for last week wanted more sweets.  She will eat a cookie when she wants more sweets. She does feel she does not eat as much.     Past Medical History:  Diagnosis Date  . Anxiety unknown  . COVID-19 virus infection 09/2019  . Morbid obesity with BMI of 45.0-49.9, adult ([place])   . Sleep apnea      Family History  Problem Relation Age of Onset  . Stroke Mother   . Hypertension Mother      Current Outpatient Medications:  .  albuterol (VENTOLIN HFA) 108 (90 Base) MCG/ACT inhaler, Inhale 2 puffs into the lungs every 6 (six) hours as needed for wheezing or shortness of breath., Disp: 8 g, Rfl: 2 .  buPROPion (WELLBUTRIN SR) 150 MG 12 hr tablet, Take 1 tablet (150 mg total) by mouth 2 (two) times daily., Disp: 180 tablet, Rfl: 1 .  ibuprofen (ADVIL) 800 MG tablet, Take 1 tablet (800 mg total) by mouth every 8 (eight) hours as needed. (Patient taking differently: Take 800 mg by mouth every 8 (eight) hours as needed  for moderate pain. ), Disp: 30 tablet, Rfl: 0 .  Insulin Pen Needle 32G X 6 MM MISC, 1 each by Does not apply route daily., Disp: 100 each, Rfl: 1 .  metFORMIN (GLUCOPHAGE) 500 MG tablet, Take 1 tablet (500 mg total) by mouth 2 (two) times daily with a meal., Disp: 180 tablet, Rfl: 1 .  NOREL AD 4-10-325 MG TABS, Take 1 tablet by mouth 2 (two) times daily as needed. (Patient not taking: Reported on 10/21/2019), Disp: 84 tablet, Rfl: 2 .  Semaglutide-Weight Management (WEGOVY) 2.4 MG/0.75ML SOAJ, Inject 2.4 mg into the skin once a week., Disp: 3 mL, Rfl: 1   No Known Allergies   Review of Systems  Constitutional: Negative.   Respiratory: Negative.   Cardiovascular: Negative.  Negative for chest pain, palpitations and leg swelling.  Gastrointestinal: Negative.   Musculoskeletal: Negative.   Psychiatric/Behavioral: Negative.      Today's Vitals   10/21/19 1557  BP: 120/76  Pulse: (!) 102  Temp: 98.2 F (36.8 C)  TempSrc: Oral  Weight: 293 lb (132.9 kg)  Height: 5\' 7"  (1.702 m)  PainSc: 0-No pain   Body mass index is 45.89 kg/m.   Objective:  Physical Exam Vitals reviewed.  Constitutional:      Appearance: Normal appearance.  Cardiovascular:     Rate and Rhythm: Normal rate  and regular rhythm.     Pulses: Normal pulses.     Heart sounds: Normal heart sounds. No murmur heard.   Pulmonary:     Effort: Pulmonary effort is normal. No respiratory distress.     Breath sounds: Normal breath sounds.  Neurological:     General: No focal deficit present.     Mental Status: She is alert and oriented to person, place, and time.     Cranial Nerves: No cranial nerve deficit.  Psychiatric:        Mood and Affect: Mood normal.        Behavior: Behavior normal.        Thought Content: Thought content normal.        Judgment: Judgment normal.         Assessment And Plan:     1. Class 3 severe obesity due to excess calories without serious comorbidity with body mass index (BMI) of  45.0 to 49.9 in adult [facility])  She is doing well with wegovy, has lost 5 lbs on the 1.7 mg dose  Will increase to 2.4 mg, tolerating well  Continue to increase physical activity - Semaglutide-Weight Management (WEGOVY) 2.4 MG/0.75ML SOAJ; Inject 2.4 mg into the skin once a week.  Dispense: 3 mL; Refill: 1     Patient was given opportunity to ask questions. Patient verbalized understanding of the plan and was able to repeat key elements of the plan. All questions were answered to their satisfaction.   Teola Bradley, FNP, have reviewed all documentation for this visit. The documentation on 11/05/19 for the exam, diagnosis, procedures, and orders are all accurate and complete.  THE PATIENT IS ENCOURAGED TO PRACTICE SOCIAL DISTANCING DUE TO THE COVID-19 PANDEMIC.

## 2019-11-06 ENCOUNTER — Other Ambulatory Visit: Payer: Self-pay | Admitting: Nurse Practitioner

## 2019-11-06 DIAGNOSIS — R7309 Other abnormal glucose: Secondary | ICD-10-CM

## 2019-11-27 MED FILL — WEGOVY 2.4 MG/0.75ML SOAJ: 2.4 | 28 days supply | Qty: 3 | Fill #1

## 2019-12-22 ENCOUNTER — Other Ambulatory Visit: Payer: Self-pay

## 2019-12-22 ENCOUNTER — Ambulatory Visit (INDEPENDENT_AMBULATORY_CARE_PROVIDER_SITE_OTHER): Payer: 59 | Admitting: Nurse Practitioner

## 2019-12-22 ENCOUNTER — Encounter: Payer: Self-pay | Admitting: Nurse Practitioner

## 2019-12-22 VITALS — BP 120/76 | HR 87 | Temp 98.4°F | Ht 67.8 in | Wt 282.2 lb

## 2019-12-22 DIAGNOSIS — Z1159 Encounter for screening for other viral diseases: Secondary | ICD-10-CM | POA: Diagnosis not present

## 2019-12-22 DIAGNOSIS — Z114 Encounter for screening for human immunodeficiency virus [HIV]: Secondary | ICD-10-CM

## 2019-12-22 DIAGNOSIS — Z6841 Body Mass Index (BMI) 40.0 and over, adult: Secondary | ICD-10-CM

## 2019-12-22 DIAGNOSIS — R7309 Other abnormal glucose: Secondary | ICD-10-CM

## 2019-12-22 MED ORDER — WEGOVY 2.4 MG/0.75ML ~~LOC~~ SOAJ: 2.4000 mg | SUBCUTANEOUS | 1 refills | Status: DC

## 2019-12-22 NOTE — Progress Notes (Signed)
I,Yamilka [employer] as a Education administrator for [employer], FNP.,have documented all relevant documentation on the behalf of [name], FNP,as directed by  [name], FNP while in the presence of [name], [place]. This visit occurred during the SARS-CoV-2 public health emergency.  Safety protocols were in place, including screening questions prior to the visit, additional usage of staff PPE, and extensive cleaning of exam room while observing appropriate contact time as indicated for disinfecting solutions.  Subjective:     Patient ID: Rhonda Murray , female    DOB: Dec 19, 1975 , 44 y.o.   MRN: [id number]   Chief Complaint  Patient presents with  . Weight Loss    HPI  Patient here for a weight check  Wt Readings from Last 3 Encounters: 12/22/19 : 282 lb 3.2 oz (128 kg) 10/21/19 : 293 lb (132.9 kg) 09/25/19 : 298 lb (135.2 kg)  She continues to take Wegovy 2.4 mg and doing well. She says she has to force herself to eat.  She has been cutting down on sweets. She is exercising more regularly.  She is in the process of moving which she has at the end of this month.  Her daughter is now in preK so she does not have as many distractions.      Past Medical History:  Diagnosis Date  . Anxiety unknown  . COVID-19 virus infection 09/2019  . Morbid obesity with BMI of 45.0-49.9, adult ([place])   . Sleep apnea      Family History  Problem Relation Age of Onset  . Stroke Mother   . Hypertension Mother      Current Outpatient Medications:  .  albuterol (VENTOLIN HFA) 108 (90 Base) MCG/ACT inhaler, Inhale 2 puffs into the lungs every 6 (six) hours as needed for wheezing or shortness of breath., Disp: 8 g, Rfl: 2 .  buPROPion (WELLBUTRIN SR) 150 MG 12 hr tablet, Take 1 tablet (150 mg total) by mouth 2 (two) times daily., Disp: 180 tablet, Rfl: 1 .  ibuprofen (ADVIL) 800 MG tablet, Take 1 tablet (800 mg total) by mouth every 8 (eight) hours as needed. (Patient taking differently: Take  800 mg by mouth every 8 (eight) hours as needed for moderate pain.), Disp: 30 tablet, Rfl: 0 .  Insulin Pen Needle 32G X 6 MM MISC, 1 each by Does not apply route daily., Disp: 100 each, Rfl: 1 .  metFORMIN (GLUCOPHAGE) 500 MG tablet, TAKE 1 TABLET (500 MG TOTAL) BY MOUTH 2 (TWO) TIMES DAILY WITH A MEAL., Disp: 180 tablet, Rfl: 1 .  NOREL AD 4-10-325 MG TABS, Take 1 tablet by mouth 2 (two) times daily as needed. (Patient not taking: No sig reported), Disp: 84 tablet, Rfl: 2 .  Semaglutide-Weight Management (WEGOVY) 2.4 MG/0.75ML SOAJ, Inject 2.4 mg into the skin once a week., Disp: 3 mL, Rfl: 1   No Known Allergies   Review of Systems  Constitutional: Negative.   HENT: Negative.   Eyes: Negative.   Respiratory: Negative.   Cardiovascular: Negative.   Gastrointestinal: Negative.   Endocrine: Negative.   Genitourinary: Negative.   Musculoskeletal: Negative.   Skin: Negative.   Neurological: Negative.   Hematological: Negative.   Psychiatric/Behavioral: Negative.      Today's Vitals   12/22/19 1616  BP: 120/76  Pulse: 87  Temp: 98.4 F (36.9 C)  TempSrc: Oral  Weight: 282 lb 3.2 oz (128 kg)  Height: 5' 7.8" (1.722 m)  PainSc: 0-No pain   Body mass index is  43.16 kg/m.   Objective:  Physical Exam Vitals reviewed.  Constitutional:      General: She is not in acute distress.    Appearance: Normal appearance. She is obese.  Cardiovascular:     Rate and Rhythm: Normal rate and regular rhythm.     Pulses: Normal pulses.     Heart sounds: Normal heart sounds. No murmur heard.   Pulmonary:     Effort: Pulmonary effort is normal. No respiratory distress.     Breath sounds: Normal breath sounds. No wheezing.  Neurological:     General: No focal deficit present.     Mental Status: She is alert and oriented to person, place, and time.     Cranial Nerves: No cranial nerve deficit.  Psychiatric:        Mood and Affect: Mood normal.        Behavior: Behavior normal.         Thought Content: Thought content normal.        Judgment: Judgment normal.         Assessment And Plan:     1. Class 3 severe obesity due to excess calories without serious comorbidity with body mass index (BMI) of 45.0 to 49.9 in adult [facility])  She continues to lose weight with JSHFWY and doing well  Congratulated on 11 lb weight loss since last visit  Encouraged to continue with regular exercise  Will increase dose of Wegovy to 2.4 mg weekly - Semaglutide-Weight Management (WEGOVY) 2.4 MG/0.75ML SOAJ; Inject 2.4 mg into the skin once a week.  Dispense: 3 mL; Refill: 1  2. Abnormal glucose  Chronic, stable  Continue with current medications - Insulin, random  3. Encounter for hepatitis C screening test for low risk patient  Will check Hepatitis C screening due to recent recommendations to screen all adults 18 years and older - Hepatitis C antibody  4. Encounter for HIV (human immunodeficiency virus) test HIV screening was previously obtained.  The patient denies specific risk factors for HIV infection such as contact with known HIV positive persons, high risk heterosexual or homosexual behaviors or IV drug usage, but requests an HIV blood test for reassurance.  She denies ongoing symptoms of AIDS such as unexplained weight loss, fevers, severe diarrhea, unusual skin lesions or lymphadenopathy.  - HIV Antibody (routine testing w rflx)    Patient was given opportunity to ask questions. Patient verbalized understanding of the plan and was able to repeat key elements of the plan. All questions were answered to their satisfaction.    Teola Bradley, FNP, have reviewed all documentation for this visit. The documentation on 12/28/19 for the exam, diagnosis, procedures, and orders are all accurate and complete.  THE PATIENT IS ENCOURAGED TO PRACTICE SOCIAL DISTANCING DUE TO THE COVID-19 PANDEMIC.

## 2019-12-22 NOTE — Patient Instructions (Signed)

## 2019-12-24 LAB — HEPATITIS C ANTIBODY: Hep C Virus Ab: <0.1 s/co ratio (ref 0.0–0.9)

## 2019-12-24 LAB — HIV ANTIBODY (ROUTINE TESTING W REFLEX): HIV Screen 4th Generation wRfx: NONREACTIVE

## 2019-12-24 LAB — INSULIN, RANDOM: INSULIN: 32 u[IU]/mL — ABNORMAL HIGH (ref 2.6–24.9)

## 2019-12-31 LAB — HM MAMMOGRAPHY

## 2020-01-01 ENCOUNTER — Encounter: Payer: Self-pay | Admitting: Nurse Practitioner

## 2020-01-08 ENCOUNTER — Other Ambulatory Visit: Payer: Self-pay | Admitting: Obstetrics and Gynecology

## 2020-01-08 DIAGNOSIS — R928 Other abnormal and inconclusive findings on diagnostic imaging of breast: Secondary | ICD-10-CM

## 2020-01-12 ENCOUNTER — Other Ambulatory Visit: Payer: Self-pay

## 2020-01-12 ENCOUNTER — Other Ambulatory Visit: Payer: Self-pay | Admitting: Nurse Practitioner

## 2020-01-12 ENCOUNTER — Encounter: Payer: Self-pay | Admitting: Nurse Practitioner

## 2020-01-12 MED ORDER — WEGOVY 1.7 MG/0.75ML ~~LOC~~ SOAJ: 1.7000 mg | SUBCUTANEOUS | 1 refills | Status: DC

## 2020-01-12 MED ORDER — WEGOVY 1 MG/0.5ML ~~LOC~~ SOAJ: 1.0000 mg | SUBCUTANEOUS | 0 refills | Status: DC

## 2020-01-12 MED FILL — WEGOVY 1.7 MG/0.75ML SOAJ: 1.7 | 28 days supply | Qty: 3 | Fill #0

## 2020-01-12 NOTE — Telephone Encounter (Signed)
[facility] called and left a message that they only have the 1.7 Wegovy in and that the pt is ok with that so if a new prescription needed to be sent in . They are out of the 2.4 mg

## 2020-01-16 ENCOUNTER — Other Ambulatory Visit: Payer: Self-pay

## 2020-01-16 ENCOUNTER — Ambulatory Visit (INDEPENDENT_AMBULATORY_CARE_PROVIDER_SITE_OTHER): Payer: 59

## 2020-01-16 DIAGNOSIS — Z23 Encounter for immunization: Secondary | ICD-10-CM

## 2020-01-16 NOTE — Progress Notes (Signed)
   Covid-19 Vaccination Clinic  Name:  Eyvette Cordon    MRN: [id number] DOB: [birth year]  01/16/2020  Ms. Rish was observed post Covid-19 immunization for 30 minutes based on pre-vaccination screening without incident. She was provided with Vaccine Information Sheet and instruction to access the V-Safe system.   Ms. Kinker was instructed to call 911 with any severe reactions post vaccine: Marland Kitchen Difficulty breathing  . Swelling of face and throat  . A fast heartbeat  . A bad rash all over body  . Dizziness and weakness   Immunizations Administered    Name Date Dose VIS Date Route   Moderna COVID-19 Vaccine 01/16/2020  9:10 AM 0.5 mL 10/29/2019 Intramuscular   Manufacturer: Moderna   Lot: [id number]   [place]: [id number]

## 2020-01-21 ENCOUNTER — Telehealth: Payer: Self-pay

## 2020-01-21 NOTE — Telephone Encounter (Signed)
Patient notified that her PA for wegovy has been approved through 08/18/20.

## 2020-01-27 ENCOUNTER — Other Ambulatory Visit: Payer: 59

## 2020-02-10 ENCOUNTER — Ambulatory Visit: Payer: 59

## 2020-02-10 ENCOUNTER — Ambulatory Visit
Admission: RE | Admit: 2020-02-10 | Discharge: 2020-02-10 | Disposition: A | Discharge status: Home / Self Care | Payer: 59 | Source: Ambulatory Visit | Attending: Obstetrics and Gynecology | Admitting: Obstetrics and Gynecology

## 2020-02-10 ENCOUNTER — Other Ambulatory Visit: Payer: Self-pay

## 2020-02-10 DIAGNOSIS — R928 Other abnormal and inconclusive findings on diagnostic imaging of breast: Secondary | ICD-10-CM

## 2020-02-18 ENCOUNTER — Other Ambulatory Visit: Payer: Self-pay

## 2020-02-18 MED ORDER — LEVALBUTEROL TARTRATE 45 MCG/ACT IN AERO: 2.0000 | INHALATION_SPRAY | Freq: Four times a day (QID) | RESPIRATORY_TRACT | 2 refills | Status: DC | PRN

## 2020-02-23 ENCOUNTER — Ambulatory Visit: Payer: 59 | Admitting: Nurse Practitioner

## 2020-03-08 ENCOUNTER — Other Ambulatory Visit: Payer: Self-pay

## 2020-03-08 ENCOUNTER — Other Ambulatory Visit: Payer: Self-pay | Admitting: Nurse Practitioner

## 2020-03-08 ENCOUNTER — Ambulatory Visit: Payer: 59 | Admitting: Nurse Practitioner

## 2020-03-08 ENCOUNTER — Encounter: Payer: Self-pay | Admitting: Nurse Practitioner

## 2020-03-08 ENCOUNTER — Ambulatory Visit (INDEPENDENT_AMBULATORY_CARE_PROVIDER_SITE_OTHER): Payer: 59 | Admitting: Nurse Practitioner

## 2020-03-08 VITALS — BP 118/76 | HR 86 | Temp 98.1°F | Ht 67.8 in | Wt 270.6 lb

## 2020-03-08 DIAGNOSIS — Z6841 Body Mass Index (BMI) 40.0 and over, adult: Secondary | ICD-10-CM

## 2020-03-08 DIAGNOSIS — E8881 Metabolic syndrome: Secondary | ICD-10-CM | POA: Diagnosis not present

## 2020-03-08 MED ORDER — WEGOVY 2.4 MG/0.75ML ~~LOC~~ SOAJ: 2.4000 mg | SUBCUTANEOUS | 1 refills | Status: DC

## 2020-03-08 MED FILL — WEGOVY 2.4 MG/0.75ML SOAJ: 2.4 | 28 days supply | Qty: 3 | Fill #0

## 2020-03-08 NOTE — Progress Notes (Signed)
Rutherford Nail as a scribe for [name], FNP.,have documented all relevant documentation on the behalf of [name], FNP,as directed by  [name], FNP while in the presence of [name], [place].  This visit occurred during the SARS-CoV-2 public health emergency.  Safety protocols were in place, including screening questions prior to the visit, additional usage of staff PPE, and extensive cleaning of exam room while observing appropriate contact time as indicated for disinfecting solutions.  Subjective:     Patient ID: Rhonda Murray , female    DOB: 1975-03-16 , 45 y.o.   MRN: [id number]   Chief Complaint  Patient presents with  . Obesity    HPI  Patient here for a weight check she has no other issues at this time. She is taking 1.7 mg Wegovy but noticed it does not curb her appetite. [place] 20-30 minutes a day at least 2-3 days a week. She is also doing a dance game.  She is not drinking as much water she should. She is still drinking sodas.    Wt Readings from Last 3 Encounters: 03/08/20 : 270 lb 9.6 oz (122.7 kg) 12/22/19 : 282 lb 3.2 oz (128 kg) 10/21/19 : 293 lb (132.9 kg)      Past Medical History:  Diagnosis Date  . Anxiety unknown  . COVID-19 virus infection 09/2019  . Morbid obesity with BMI of 45.0-49.9, adult ([place])   . Sleep apnea      Family History  Problem Relation Age of Onset  . Stroke Mother   . Hypertension Mother      Current Outpatient Medications:  .  albuterol (VENTOLIN HFA) 108 (90 Base) MCG/ACT inhaler, Inhale 2 puffs into the lungs every 6 (six) hours as needed for wheezing or shortness of breath., Disp: 8 g, Rfl: 2 .  buPROPion (WELLBUTRIN SR) 150 MG 12 hr tablet, Take 1 tablet (150 mg total) by mouth 2 (two) times daily., Disp: 180 tablet, Rfl: 1 .  ibuprofen (ADVIL) 800 MG tablet, Take 1 tablet (800 mg total) by mouth every 8 (eight) hours as needed. (Patient taking  differently: Take 800 mg by mouth every 8 (eight) hours as needed for moderate pain.), Disp: 30 tablet, Rfl: 0 .  metFORMIN (GLUCOPHAGE) 500 MG tablet, TAKE 1 TABLET (500 MG TOTAL) BY MOUTH 2 (TWO) TIMES DAILY WITH A MEAL., Disp: 180 tablet, Rfl: 1 .  Insulin Pen Needle 32G X 6 MM MISC, 1 each by Does not apply route daily. (Patient not taking: Reported on 03/08/2020), Disp: 100 each, Rfl: 1 .  Semaglutide-Weight Management (WEGOVY) 2.4 MG/0.75ML SOAJ, Inject 2.4 mg into the skin once a week., Disp: 3 mL, Rfl: 1   No Known Allergies   Review of Systems  Constitutional: Negative.  Negative for fatigue.  HENT: Negative.   Respiratory: Negative.   Cardiovascular: Negative.  Negative for chest pain, palpitations and leg swelling.  Endocrine: Negative for polydipsia, polyphagia and polyuria.  Musculoskeletal: Negative.   Skin: Negative.   Neurological: Negative for dizziness and headaches.  Psychiatric/Behavioral: Negative.      Today's Vitals   03/08/20 1147  BP: 118/76  Pulse: 86  Temp: 98.1 F (36.7 C)  TempSrc: Oral  Weight: 270 lb 9.6 oz (122.7 kg)  Height: 5' 7.8" (1.722 m)   Body mass index is 41.39 kg/m.  Wt Readings from Last 3 Encounters:  03/08/20 270 lb 9.6 oz (122.7 kg)  12/22/19 282  lb 3.2 oz (128 kg)  10/21/19 293 lb (132.9 kg)   Objective:  Physical Exam Vitals reviewed.  Constitutional:      General: She is not in acute distress.    Appearance: Normal appearance. She is obese.  Cardiovascular:     Rate and Rhythm: Normal rate and regular rhythm.     Pulses: Normal pulses.     Heart sounds: Normal heart sounds. No murmur heard.   Pulmonary:     Effort: Pulmonary effort is normal. No respiratory distress.     Breath sounds: Normal breath sounds. No wheezing.  Neurological:     General: No focal deficit present.     Mental Status: She is alert and oriented to person, place, and time.     Cranial Nerves: No cranial nerve deficit.  Psychiatric:         Mood and Affect: Mood normal.        Behavior: Behavior normal.        Thought Content: Thought content normal.        Judgment: Judgment normal.         Assessment And Plan:     1. Morbid obesity with BMI of 41.0-49.9, adult ([place])  ASK- follow up appt.  ASSESS - congratulated on losing 12 lbs since her last visit.  Body mass index is 41.39 kg/m., tolerating medication well, she has noticed an increase in appetite with current dose.   ADVISE  - on risk of cardiovascular disease currently has a diagnosis of hypertension, on medications. AGREE - to increase her physical activity by incorporating more strength training, follow up in 2 months ASSIST - will increase to 2.4 mg weekly.  Encouraged to increase her water intake She is encouraged to strive for BMI less than 30 to decrease cardiac risk. Advised to aim for at least 150 minutes of exercise per week - Semaglutide-Weight Management (WEGOVY) 2.4 MG/0.75ML SOAJ; Inject 2.4 mg into the skin once a week.  Dispense: 3 mL; Refill: 1   2. Insulin resistance  Continues to be elevated at last visit.     Patient was given opportunity to ask questions. Patient verbalized understanding of the plan and was able to repeat key elements of the plan. All questions were answered to their satisfaction.  [name], FNP   I, [name], FNP, have reviewed all documentation for this visit. The documentation on 03/08/20 for the exam, diagnosis, procedures, and orders are all accurate and complete.  THE PATIENT IS ENCOURAGED TO PRACTICE SOCIAL DISTANCING DUE TO THE COVID-19 PANDEMIC.

## 2020-04-09 ENCOUNTER — Other Ambulatory Visit: Payer: Self-pay | Admitting: Nurse Practitioner

## 2020-04-09 DIAGNOSIS — F32A Depression, unspecified: Secondary | ICD-10-CM

## 2020-04-20 ENCOUNTER — Other Ambulatory Visit (HOSPITAL_COMMUNITY): Payer: Self-pay

## 2020-04-20 MED FILL — Semaglutide (Weight Mngmt) Soln Auto-Injector 2.4 MG/0.75ML: SUBCUTANEOUS | 28 days supply | Qty: 3 | Fill #0 | Status: AC

## 2020-05-06 ENCOUNTER — Ambulatory Visit: Payer: 59 | Admitting: Nurse Practitioner

## 2020-05-27 ENCOUNTER — Other Ambulatory Visit: Payer: Self-pay | Admitting: Nurse Practitioner

## 2020-05-27 ENCOUNTER — Other Ambulatory Visit (HOSPITAL_COMMUNITY): Payer: Self-pay

## 2020-05-27 DIAGNOSIS — Z6841 Body Mass Index (BMI) 40.0 and over, adult: Secondary | ICD-10-CM

## 2020-05-27 MED ORDER — WEGOVY 2.4 MG/0.75ML ~~LOC~~ SOAJ
SUBCUTANEOUS | 1 refills | Status: DC
  Filled 2020-05-27: qty 3, 28d supply, fill #0

## 2020-05-28 ENCOUNTER — Other Ambulatory Visit (HOSPITAL_COMMUNITY): Payer: Self-pay

## 2020-06-09 ENCOUNTER — Encounter: Payer: Self-pay | Admitting: Nurse Practitioner

## 2020-06-09 ENCOUNTER — Other Ambulatory Visit (HOSPITAL_COMMUNITY): Payer: Self-pay

## 2020-06-09 ENCOUNTER — Other Ambulatory Visit: Payer: Self-pay

## 2020-06-09 ENCOUNTER — Ambulatory Visit (INDEPENDENT_AMBULATORY_CARE_PROVIDER_SITE_OTHER): Payer: 59 | Admitting: Nurse Practitioner

## 2020-06-09 VITALS — BP 132/80 | HR 104 | Temp 98.6°F | Ht 67.8 in | Wt 270.8 lb

## 2020-06-09 DIAGNOSIS — E8881 Metabolic syndrome: Secondary | ICD-10-CM

## 2020-06-09 DIAGNOSIS — R7309 Other abnormal glucose: Secondary | ICD-10-CM

## 2020-06-09 DIAGNOSIS — Z0000 Encounter for general adult medical examination without abnormal findings: Secondary | ICD-10-CM

## 2020-06-09 DIAGNOSIS — Z6841 Body Mass Index (BMI) 40.0 and over, adult: Secondary | ICD-10-CM

## 2020-06-09 MED ORDER — WEGOVY 2.4 MG/0.75ML ~~LOC~~ SOAJ
2.4000 mg | SUBCUTANEOUS | 1 refills | Status: DC
  Filled 2020-06-09: qty 3, 28d supply, fill #0

## 2020-06-09 NOTE — Patient Instructions (Signed)
Health Maintenance, Female Adopting a healthy lifestyle and getting preventive care are important in promoting health and wellness. Ask your health care provider about:  The right schedule for you to have regular tests and exams.  Things you can do on your own to prevent diseases and keep yourself healthy. What should I know about diet, weight, and exercise? Eat a healthy diet  Eat a diet that includes plenty of vegetables, fruits, low-fat dairy products, and lean protein.  Do not eat a lot of foods that are high in solid fats, added sugars, or sodium.   Maintain a healthy weight Body mass index (BMI) is used to identify weight problems. It estimates body fat based on height and weight. Your health care provider can help determine your BMI and help you achieve or maintain a healthy weight. Get regular exercise Get regular exercise. This is one of the most important things you can do for your health. Most adults should:  Exercise for at least 150 minutes each week. The exercise should increase your heart rate and make you sweat (moderate-intensity exercise).  Do strengthening exercises at least twice a week. This is in addition to the moderate-intensity exercise.  Spend less time sitting. Even light physical activity can be beneficial. Watch cholesterol and blood lipids Have your blood tested for lipids and cholesterol at 43 years of age, then have this test every 5 years. Have your cholesterol levels checked more often if:  Your lipid or cholesterol levels are high.  You are older than 43 years of age.  You are at high risk for heart disease. What should I know about cancer screening? Depending on your health history and family history, you may need to have cancer screening at various ages. This may include screening for:  Breast cancer.  Cervical cancer.  Colorectal cancer.  Skin cancer.  Lung cancer. What should I know about heart disease, diabetes, and high blood  pressure? Blood pressure and heart disease  High blood pressure causes heart disease and increases the risk of stroke. This is more likely to develop in people who have high blood pressure readings, are of African descent, or are overweight.  Have your blood pressure checked: ? Every 3-5 years if you are 18-39 years of age. ? Every year if you are 40 years old or older. Diabetes Have regular diabetes screenings. This checks your fasting blood sugar level. Have the screening done:  Once every three years after age 40 if you are at a normal weight and have a low risk for diabetes.  More often and at a younger age if you are overweight or have a high risk for diabetes. What should I know about preventing infection? Hepatitis B If you have a higher risk for hepatitis B, you should be screened for this virus. Talk with your health care provider to find out if you are at risk for hepatitis B infection. Hepatitis C Testing is recommended for:  Everyone born from 1945 through 1965.  Anyone with known risk factors for hepatitis C. Sexually transmitted infections (STIs)  Get screened for STIs, including gonorrhea and chlamydia, if: ? You are sexually active and are younger than 43 years of age. ? You are older than 43 years of age and your health care provider tells you that you are at risk for this type of infection. ? Your sexual activity has changed since you were last screened, and you are at increased risk for chlamydia or gonorrhea. Ask your health care provider   if you are at risk.  Ask your health care provider about whether you are at high risk for HIV. Your health care provider may recommend a prescription medicine to help prevent HIV infection. If you choose to take medicine to prevent HIV, you should first get tested for HIV. You should then be tested every 3 months for as long as you are taking the medicine. Pregnancy  If you are about to stop having your period (premenopausal) and  you may become pregnant, seek counseling before you get pregnant.  Take 400 to 800 micrograms (mcg) of folic acid every day if you become pregnant.  Ask for birth control (contraception) if you want to prevent pregnancy. Osteoporosis and menopause Osteoporosis is a disease in which the bones lose minerals and strength with aging. This can result in bone fractures. If you are 65 years old or older, or if you are at risk for osteoporosis and fractures, ask your health care provider if you should:  Be screened for bone loss.  Take a calcium or vitamin D supplement to lower your risk of fractures.  Be given hormone replacement therapy (HRT) to treat symptoms of menopause. Follow these instructions at home: Lifestyle  Do not use any products that contain nicotine or tobacco, such as cigarettes, e-cigarettes, and chewing tobacco. If you need help quitting, ask your health care provider.  Do not use street drugs.  Do not share needles.  Ask your health care provider for help if you need support or information about quitting drugs. Alcohol use  Do not drink alcohol if: ? Your health care provider tells you not to drink. ? You are pregnant, may be pregnant, or are planning to become pregnant.  If you drink alcohol: ? Limit how much you use to 0-1 drink a day. ? Limit intake if you are breastfeeding.  Be aware of how much alcohol is in your drink. In the [place] one drink equals one 12 oz bottle of beer (355 mL), one 5 oz glass of wine (148 mL), or one 1 oz glass of hard liquor (44 mL). General instructions  Schedule regular health, dental, and eye exams.  Stay current with your vaccines.  Tell your health care provider if: ? You often feel depressed. ? You have ever been abused or do not feel safe at home. Summary  Adopting a healthy lifestyle and getting preventive care are important in promoting health and wellness.  Follow your health care provider's instructions about healthy  diet, exercising, and getting tested or screened for diseases.  Follow your health care provider's instructions on monitoring your cholesterol and blood pressure. This information is not intended to replace advice given to you by your health care provider. Make sure you discuss any questions you have with your health care provider. Document Revised: 12/19/2017 Document Reviewed: 12/19/2017 Elsevier Patient Education  2021 Elsevier Inc.  
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
## 2020-06-10 ENCOUNTER — Ambulatory Visit: Payer: 59 | Admitting: Nurse Practitioner

## 2020-06-10 LAB — CBC
Hematocrit: 37.3 % (ref 34.0–46.6)
Hemoglobin: 12.6 g/dL (ref 11.1–15.9)
MCH: 31.5 pg (ref 26.6–33.0)
MCHC: 33.8 g/dL (ref 31.5–35.7)
MCV: 93 fL (ref 79–97)
Platelets: 327 10*3/uL (ref 150–450)
RBC: 4 x10E6/uL (ref 3.77–5.28)
RDW: 12.1 % (ref 11.7–15.4)
WBC: 11.3 10*3/uL — ABNORMAL HIGH (ref 3.4–10.8)

## 2020-06-10 LAB — CMP14+EGFR
ALT: 31 IU/L (ref 0–32)
AST: 23 IU/L (ref 0–40)
Albumin/Globulin Ratio: 1.1 — ABNORMAL LOW (ref 1.2–2.2)
Albumin: 3.9 g/dL (ref 3.8–4.8)
Alkaline Phosphatase: 111 IU/L (ref 44–121)
BUN/Creatinine Ratio: 9 (ref 9–23)
BUN: 8 mg/dL (ref 6–24)
Bilirubin Total: 0.4 mg/dL (ref 0.0–1.2)
CO2: 20 mmol/L (ref 20–29)
Calcium: 9.2 mg/dL (ref 8.7–10.2)
Chloride: 102 mmol/L (ref 96–106)
Creatinine, Ser: 0.94 mg/dL (ref 0.57–1.00)
Globulin, Total: 3.5 g/dL (ref 1.5–4.5)
Glucose: 76 mg/dL (ref 65–99)
Potassium: 3.4 mmol/L — ABNORMAL LOW (ref 3.5–5.2)
Sodium: 139 mmol/L (ref 134–144)
Total Protein: 7.4 g/dL (ref 6.0–8.5)
eGFR: 77 mL/min/{1.73_m2} (ref >59)

## 2020-06-10 LAB — HEMOGLOBIN A1C
Est. average glucose Bld gHb Est-mCnc: 103 mg/dL
Hgb A1c MFr Bld: 5.2 % (ref 4.8–5.6)

## 2020-06-10 LAB — LIPID PANEL
Chol/HDL Ratio: 3 ratio (ref 0.0–4.4)
Cholesterol, Total: 134 mg/dL (ref 100–199)
HDL: 45 mg/dL (ref >39)
LDL Chol Calc (NIH): 72 mg/dL (ref 0–99)
Triglycerides: 91 mg/dL (ref 0–149)
VLDL Cholesterol Cal: 17 mg/dL (ref 5–40)

## 2020-06-10 LAB — VITAMIN D 25 HYDROXY (VIT D DEFICIENCY, FRACTURES): Vit D, 25-Hydroxy: 21.7 ng/mL — ABNORMAL LOW (ref 30.0–100.0)

## 2020-06-10 LAB — INSULIN, RANDOM: INSULIN: 21 u[IU]/mL (ref 2.6–24.9)

## 2020-06-14 ENCOUNTER — Encounter: Payer: Self-pay | Admitting: Nurse Practitioner

## 2020-06-18 ENCOUNTER — Other Ambulatory Visit (HOSPITAL_COMMUNITY): Payer: Self-pay

## 2020-06-18 ENCOUNTER — Encounter: Payer: Self-pay | Admitting: Nurse Practitioner

## 2020-06-25 ENCOUNTER — Other Ambulatory Visit (HOSPITAL_COMMUNITY): Payer: Self-pay

## 2020-06-28 ENCOUNTER — Other Ambulatory Visit (HOSPITAL_COMMUNITY): Payer: Self-pay

## 2020-07-05 ENCOUNTER — Other Ambulatory Visit: Payer: Self-pay | Admitting: Nurse Practitioner

## 2020-07-05 ENCOUNTER — Other Ambulatory Visit: Payer: Self-pay

## 2020-07-05 DIAGNOSIS — D72829 Elevated white blood cell count, unspecified: Secondary | ICD-10-CM

## 2020-07-05 DIAGNOSIS — E559 Vitamin D deficiency, unspecified: Secondary | ICD-10-CM

## 2020-07-05 MED ORDER — VITAMIN D (ERGOCALCIFEROL) 1.25 MG (50000 UNIT) PO CAPS: 50000.0000 [IU] | ORAL_CAPSULE | ORAL | 1 refills | Status: DC

## 2020-07-05 NOTE — Progress Notes (Signed)
I have sent vitamin d 50,000 units weekly for 12 weeks we will recheck levels

## 2020-07-08 ENCOUNTER — Other Ambulatory Visit: Payer: Self-pay

## 2020-08-12 ENCOUNTER — Ambulatory Visit (INDEPENDENT_AMBULATORY_CARE_PROVIDER_SITE_OTHER): Payer: 59 | Admitting: Nurse Practitioner

## 2020-08-12 ENCOUNTER — Other Ambulatory Visit: Payer: Self-pay

## 2020-08-12 ENCOUNTER — Encounter: Payer: Self-pay | Admitting: Nurse Practitioner

## 2020-08-12 VITALS — Temp 98.4°F | Ht 67.2 in | Wt 282.0 lb

## 2020-08-12 DIAGNOSIS — D72829 Elevated white blood cell count, unspecified: Secondary | ICD-10-CM

## 2020-08-12 DIAGNOSIS — R41840 Attention and concentration deficit: Secondary | ICD-10-CM | POA: Diagnosis not present

## 2020-08-12 DIAGNOSIS — E8881 Metabolic syndrome: Secondary | ICD-10-CM

## 2020-08-12 DIAGNOSIS — R7309 Other abnormal glucose: Secondary | ICD-10-CM | POA: Diagnosis not present

## 2020-08-12 DIAGNOSIS — Z6841 Body Mass Index (BMI) 40.0 and over, adult: Secondary | ICD-10-CM

## 2020-08-12 MED ORDER — OZEMPIC (0.25 OR 0.5 MG/DOSE) 2 MG/1.5ML ~~LOC~~ SOPN: 0.5000 mg | PEN_INJECTOR | SUBCUTANEOUS | 1 refills | Status: DC

## 2020-08-12 NOTE — Progress Notes (Signed)
I,Tianna Badgett,acting as a Education administrator for [employer], FNP.,have documented all relevant documentation on the behalf of [name], FNP,as directed by  [name], FNP while in the presence of [name], [place].  This visit occurred during the SARS-CoV-2 public health emergency.  Safety protocols were in place, including screening questions prior to the visit, additional usage of staff PPE, and extensive cleaning of exam room while observing appropriate contact time as indicated for disinfecting solutions.  Subjective:     Patient ID: Rhonda Murray , female    DOB: 11/21/1975 , 45 y.o.   MRN: [id number]   Chief Complaint  Patient presents with   Weight Check    HPI  Patient here for a weight check. She has complaints of dizziness. She states that it started about 2 weeks ago. When she noticed it she increased her water intake and her symptoms have not subsided but they have decreased. She has been without the Wegovy x 1 month. She is exercising more regularly by walking daily. She has been using resistance bands.  She has been eating better with hummus. She is working from 830 - 130 then 8p - 11p.    Wt Readings from Last 3 Encounters: 08/12/20 : 282 lb (127.9 kg) 06/09/20 : 270 lb 12.8 oz (122.8 kg) 03/08/20 : 270 lb 9.6 oz (122.7 kg)      Past Medical History:  Diagnosis Date   Anxiety unknown   COVID-19 virus infection 09/2019   Morbid obesity with BMI of 45.0-49.9, adult (HCC)    Sleep apnea      Family History  Problem Relation Age of Onset   Stroke Mother    Hypertension Mother      Current Outpatient Medications:    albuterol (VENTOLIN HFA) 108 (90 Base) MCG/ACT inhaler, Inhale 2 puffs into the lungs every 6 (six) hours as needed for wheezing or shortness of breath., Disp: 8 g, Rfl: 2   buPROPion (WELLBUTRIN SR) 150 MG 12 hr tablet, TAKE 1 TABLET BY MOUTH EVERY DAY, Disp: 90 tablet, Rfl: 1   ibuprofen (ADVIL) 800 MG tablet, Take 1 tablet (800 mg total) by mouth  every 8 (eight) hours as needed. (Patient taking differently: Take 800 mg by mouth every 8 (eight) hours as needed for moderate pain.), Disp: 30 tablet, Rfl: 0   Insulin Pen Needle 32G X 6 MM MISC, 1 each by Does not apply route daily., Disp: 100 each, Rfl: 1   metFORMIN (GLUCOPHAGE) 500 MG tablet, TAKE 1 TABLET (500 MG TOTAL) BY MOUTH 2 (TWO) TIMES DAILY WITH A MEAL., Disp: 180 tablet, Rfl: 1   Semaglutide,0.25 or 0.5MG/DOS, (OZEMPIC, 0.25 OR 0.5 MG/DOSE,) 2 MG/1.5ML SOPN, Inject 0.5 mg into the skin once a week., Disp: 4.5 mL, Rfl: 1   Vitamin D, Ergocalciferol, (DRISDOL) 1.25 MG (50000 UNIT) CAPS capsule, Take 1 capsule (50,000 Units total) by mouth every 7 (seven) days., Disp: 12 capsule, Rfl: 1   No Known Allergies   Review of Systems  Constitutional: Negative.   Respiratory: Negative.    Cardiovascular: Negative.  Negative for chest pain, palpitations and leg swelling.  Gastrointestinal: Negative.   Neurological:  Positive for dizziness (reports she leaned into the wall dizzy, needed to take a minute to stop. She increased her water intake.). Negative for headaches.  Psychiatric/Behavioral: Negative.      Today's Vitals   08/12/20 1457  Temp: 98.4 F (36.9 C)  TempSrc: Oral  Weight: 282 lb (127.9 kg)  Height: 5' 7.2" (1.707  m)   Body mass index is 43.9 kg/m.  Wt Readings from Last 3 Encounters:  08/12/20 282 lb (127.9 kg)  06/09/20 270 lb 12.8 oz (122.8 kg)  03/08/20 270 lb 9.6 oz (122.7 kg)    Objective:  Physical Exam Vitals reviewed.  Constitutional:      General: She is not in acute distress.    Appearance: Normal appearance. She is obese.  Cardiovascular:     Rate and Rhythm: Normal rate and regular rhythm.     Pulses: Normal pulses.     Heart sounds: Normal heart sounds. No murmur heard. Pulmonary:     Effort: Pulmonary effort is normal. No respiratory distress.     Breath sounds: Normal breath sounds. No wheezing.  Neurological:     General: No focal deficit  present.     Mental Status: She is alert and oriented to person, place, and time.     Cranial Nerves: No cranial nerve deficit.     Motor: No weakness.     Coordination: Romberg sign negative. Coordination normal.  Psychiatric:        Mood and Affect: Mood normal.        Behavior: Behavior normal.        Thought Content: Thought content normal.        Judgment: Judgment normal.        Assessment And Plan:     1. Morbid obesity with BMI of 41.0-49.9, adult ([place]) She is encouraged to strive for BMI less than 30 to decrease cardiac risk. Advised to aim for at least 150 minutes of exercise per week.  2. Insulin resistance Comments: She will increase Ozempic 0.5 mg weekly. Will start her on Ozempic - Semaglutide,0.25 or 0.5MG/DOS, (OZEMPIC, 0.25 OR 0.5 MG/DOSE,) 2 MG/1.5ML SOPN; Inject 0.5 mg into the skin once a week.  Dispense: 4.5 mL; Refill: 1  3. Abnormal glucose - Semaglutide,0.25 or 0.5MG/DOS, (OZEMPIC, 0.25 OR 0.5 MG/DOSE,) 2 MG/1.5ML SOPN; Inject 0.5 mg into the skin once a week.  Dispense: 4.5 mL; Refill: 1  4. Concentration deficit Comments: ASRS was done with 14 positive responses Will refer to [facility] - Ambulatory referral to Psychiatry  5. Leukocytosis, unspecified type - BMP8+eGFR     Patient was given opportunity to ask questions. Patient verbalized understanding of the plan and was able to repeat key elements of the plan. All questions were answered to their satisfaction.  [name], FNP   I, [name], FNP, have reviewed all documentation for this visit. The documentation on 09/01/20 for the exam, diagnosis, procedures, and orders are all accurate and complete.   IF YOU HAVE BEEN REFERRED TO A SPECIALIST, IT MAY TAKE 1-2 WEEKS TO SCHEDULE/PROCESS THE REFERRAL. IF YOU HAVE NOT HEARD FROM US/SPECIALIST IN TWO WEEKS, PLEASE GIVE [place] A CALL AT [phone].   THE PATIENT IS ENCOURAGED TO PRACTICE SOCIAL DISTANCING DUE TO THE COVID-19 PANDEMIC.

## 2020-08-12 NOTE — Patient Instructions (Signed)
Obesity, Adult Obesity is having too much body fat. Being obese means that your weight is morethan what is healthy for you. BMI is a number that explains how much body fat you have. If you have a BMI of 30 or more, you are obese. Obesity is often caused by eating or drinking morecalories than your body uses. Changing your lifestyle can help you lose weight. Obesity can cause serious health problems, such as: Stroke. Coronary artery disease (CAD). Type 2 diabetes. Some types of cancer, including cancers of the colon, breast, uterus, and gallbladder. Osteoarthritis. High blood pressure (hypertension). High cholesterol. Sleep apnea. Gallbladder stones. Infertility problems. What are the causes? Eating meals each day that are high in calories, sugar, and fat. Being born with genes that may make you more likely to become obese. Having a medical condition that causes obesity. Taking certain medicines. Sitting a lot (having a sedentary lifestyle). Not getting enough sleep. Drinking a lot of drinks that have sugar in them. What increases the risk? Having a family history of obesity. Being an [place] American woman. Being a Hispanic man. Living in an area with limited access to: [place], recreation centers, or sidewalks. Healthy food choices, such as grocery stores and farmers' markets. What are the signs or symptoms? The main sign is having too much body fat. How is this treated? Treatment for this condition often includes changing your lifestyle. Treatment may include: Changing your diet. This may include making a healthy meal plan. Exercise. This may include activity that causes your heart to beat faster (aerobic exercise) and strength training. Work with your doctor to design a program that works for you. Medicine to help you lose weight. This may be used if you are not able to lose 1 pound a week after 6 weeks of healthy eating and more exercise. Treating conditions that cause the  obesity. Surgery. Options may include gastric banding and gastric bypass. This may be done if: Other treatments have not helped to improve your condition. You have a BMI of 40 or higher. You have life-threatening health problems related to obesity. Follow these instructions at home: Eating and drinking  Follow advice from your doctor about what to eat and drink. Your doctor may tell you to: Limit fast food, sweets, and processed snack foods. Choose low-fat options. For example, choose low-fat milk instead of whole milk. Eat 5 or more servings of fruits or vegetables each day. Eat at home more often. This gives you more control over what you eat. Choose healthy foods when you eat out. Learn to read food labels. This will help you learn how much food is in 1 serving. Keep low-fat snacks available. Avoid drinks that have a lot of sugar in them. These include soda, fruit juice, iced tea with sugar, and flavored milk. Drink enough water to keep your pee (urine) pale yellow. Do not go on fad diets.  Physical activity Exercise often, as told by your doctor. Most adults should get up to 150 minutes of moderate-intensity exercise every week.Ask your doctor: What types of exercise are safe for you. How often you should exercise. Warm up and stretch before being active. Do slow stretching after being active (cool down). Rest between times of being active. Lifestyle Work with your doctor and a food expert (dietitian) to set a weight-loss goal that is best for you. Limit your screen time. Find ways to reward yourself that do not involve food. Do not drink alcohol if: Your doctor tells you not to drink.  You are pregnant, may be pregnant, or are planning to become pregnant. If you drink alcohol: Limit how much you use to: 0-1 drink a day for women. 0-2 drinks a day for men. Be aware of how much alcohol is in your drink. In the [place] one drink equals one 12 oz bottle of beer (355 mL), one 5 oz  glass of wine (148 mL), or one 1 oz glass of hard liquor (44 mL). General instructions Keep a weight-loss journal. This can help you keep track of: The food that you eat. How much exercise you get. Take over-the-counter and prescription medicines only as told by your doctor. Take vitamins and supplements only as told by your doctor. Think about joining a support group. Keep all follow-up visits as told by your doctor. This is important. Contact a doctor if: You cannot meet your weight loss goal after you have changed your diet and lifestyle for 6 weeks. Get help right away if you: Are having trouble breathing. Are having thoughts of harming yourself. Summary Obesity is having too much body fat. Being obese means that your weight is more than what is healthy for you. Work with your doctor to set a weight-loss goal. Get regular exercise as told by your doctor. This information is not intended to replace advice given to you by your health care provider. Make sure you discuss any questions you have with your healthcare provider. Document Revised: 08/30/2017 Document Reviewed: 08/30/2017 Elsevier Patient Education  2022 [employer].

## 2020-08-13 ENCOUNTER — Encounter: Payer: Self-pay | Admitting: Nurse Practitioner

## 2020-08-13 LAB — BMP8+EGFR
BUN/Creatinine Ratio: 8 — ABNORMAL LOW (ref 9–23)
BUN: 8 mg/dL (ref 6–24)
CO2: 25 mmol/L (ref 20–29)
Calcium: 9.3 mg/dL (ref 8.7–10.2)
Chloride: 102 mmol/L (ref 96–106)
Creatinine, Ser: 1.02 mg/dL — ABNORMAL HIGH (ref 0.57–1.00)
Glucose: 90 mg/dL (ref 65–99)
Potassium: 3.9 mmol/L (ref 3.5–5.2)
Sodium: 141 mmol/L (ref 134–144)
eGFR: 70 mL/min/{1.73_m2} (ref >59)

## 2020-09-02 ENCOUNTER — Encounter: Payer: Self-pay | Admitting: Nurse Practitioner

## 2020-10-13 ENCOUNTER — Ambulatory Visit: Payer: 59 | Admitting: Nurse Practitioner

## 2020-10-18 ENCOUNTER — Ambulatory Visit (INDEPENDENT_AMBULATORY_CARE_PROVIDER_SITE_OTHER): Payer: 59 | Admitting: Adult Health

## 2020-10-18 ENCOUNTER — Other Ambulatory Visit: Payer: Self-pay

## 2020-10-18 ENCOUNTER — Encounter: Payer: Self-pay | Admitting: Adult Health

## 2020-10-18 VITALS — BP 130/75 | HR 80 | Ht 67.0 in | Wt 289.0 lb

## 2020-10-18 DIAGNOSIS — F909 Attention-deficit hyperactivity disorder, unspecified type: Secondary | ICD-10-CM

## 2020-10-18 DIAGNOSIS — F411 Generalized anxiety disorder: Secondary | ICD-10-CM | POA: Diagnosis not present

## 2020-10-18 MED ORDER — AMPHETAMINE-DEXTROAMPHET ER 20 MG PO CP24: 20.0000 mg | ORAL_CAPSULE | Freq: Every day | ORAL | 0 refills | Status: DC

## 2020-10-18 MED ORDER — ESCITALOPRAM OXALATE 10 MG PO TABS: 10.0000 mg | ORAL_TABLET | Freq: Every day | ORAL | 2 refills | Status: DC

## 2020-10-18 NOTE — Progress Notes (Signed)
[facility] MD/PA/NP Initial Note  10/18/2020 12:59 PM Rhonda Murray  MRN:  [id number]  Chief Complaint:   HPI:  Describes mood today as "ok". Pleasant. Mood symptoms reports - depression - "sometimes", anxiety - "all the time", and irritability - "at times". Increased anxiety in social settings - more introverted. Currently taking Wellbutrin SR 150mg  - 4 x a week and feels like it helps on the days she needs - working on taking every day. Forgets to take it on the weekends. Feels like she needs help with feeling "foggy". Not focused. Forgetful. Easily distracted. Has struggled with symptoms throughout her life. Struggles with worry and rumination at bedtime. Reports irrational thoughts related to children. Trying to sleep with a noise or buffer to cut her mind off. Stable interest and motivation. Taking medications as prescribed.  Energy levels stable. Active, does not have a regular exercise routine.  Enjoys some usual interests and activities. Single, has a significant other. Has a son age 74 and a daughter age 36. Spending time with family. Appetite adequate - "comes and goes". Weight stable - 289 pounds - up and down. Sleeps well most nights. Averages 5 to 6 hours. Focus and concentration difficulties - worse over the past month. Reports a long history of issues. Struggled in school. Completing tasks. Managing aspects of household. Graduated from [employer]. Works in Insurance claims handler. Has applied for a different job with different hours. Denies SI or HI.  Denies AH or VH.  Previous medication trials:  Wellbutrin, Zoloft  Visit Diagnosis:    ICD-10-CM   1. Attention deficit hyperactivity disorder (ADHD), unspecified ADHD type  F90.9 amphetamine-dextroamphetamine (ADDERALL XR) 20 MG 24 hr capsule    2. Generalized anxiety disorder  F41.1 escitalopram (LEXAPRO) 10 MG tablet      Past Psychiatric History: Denies psychiatric hospitalization.   Past Medical History:  Past Medical History:   Diagnosis Date   Anxiety unknown   COVID-19 virus infection 09/2019   Morbid obesity with BMI of 45.0-49.9, adult [facility])    Sleep apnea     Past Surgical History:  Procedure Laterality Date   CESAREAN SECTION N/A 11/09/2014   Procedure: CESAREAN SECTION;  Surgeon: Paula Compton, MD;  Location: [facility];  Service: Obstetrics;  Laterality: N/A;   LEEP     MASS EXCISION N/A 04/24/2019   Procedure: EXCISION OF ABDOMINAL WALL MASS;  Surgeon: Erroll Luna, MD;  Location: [facility];  Service: General;  Laterality: N/A;    Family Psychiatric History: Family history of mental illness - sister with Bipolar.  Family History:  Family History  Problem Relation Age of Onset   Stroke Mother    Hypertension Mother     Social History:  Social History   Socioeconomic History   Marital status: Married    Spouse name: Not on file   Number of children: Not on file   Years of education: Not on file   Highest education level: Not on file  Occupational History   Not on file  Tobacco Use   Smoking status: Former   Smokeless tobacco: Never  Vaping Use   Vaping Use: Never used  Substance and Sexual Activity   Alcohol use: No   Drug use: No   Sexual activity: Not on file  Other Topics Concern   Not on file  Social History Narrative   Not on file   Social Determinants of Health   Financial Resource Strain: Not on file  Food Insecurity: Not on file  Transportation  Needs: Not on file  Physical Activity: Not on file  Stress: Not on file  Social Connections: Not on file    Allergies: No Known Allergies  Metabolic Disorder Labs: Lab Results  Component Value Date   HGBA1C 5.2 06/09/2020   No results found for: PROLACTIN Lab Results  Component Value Date   CHOL 134 06/09/2020   TRIG 91 06/09/2020   HDL 45 06/09/2020   CHOLHDL 3.0 06/09/2020   VLDL 20.4 12/11/2012   LDLCALC 72 06/09/2020   LDLCALC 87 11/05/2017   Lab Results  Component Value Date   TSH  1.370 04/28/2019   TSH 1.920 11/05/2017    Therapeutic Level Labs: No results found for: LITHIUM No results found for: VALPROATE No components found for:  CBMZ  Current Medications: Current Outpatient Medications  Medication Sig Dispense Refill   amphetamine-dextroamphetamine (ADDERALL XR) 20 MG 24 hr capsule Take 1 capsule (20 mg total) by mouth daily. 30 capsule 0   escitalopram (LEXAPRO) 10 MG tablet Take 1 tablet (10 mg total) by mouth daily. 30 tablet 2   albuterol (VENTOLIN HFA) 108 (90 Base) MCG/ACT inhaler Inhale 2 puffs into the lungs every 6 (six) hours as needed for wheezing or shortness of breath. 8 g 2   buPROPion (WELLBUTRIN SR) 150 MG 12 hr tablet TAKE 1 TABLET BY MOUTH EVERY DAY 90 tablet 1   ibuprofen (ADVIL) 800 MG tablet Take 1 tablet (800 mg total) by mouth every 8 (eight) hours as needed. (Patient taking differently: Take 800 mg by mouth every 8 (eight) hours as needed for moderate pain.) 30 tablet 0   Insulin Pen Needle 32G X 6 MM MISC 1 each by Does not apply route daily. 100 each 1   metFORMIN (GLUCOPHAGE) 500 MG tablet TAKE 1 TABLET (500 MG TOTAL) BY MOUTH 2 (TWO) TIMES DAILY WITH A MEAL. 180 tablet 1   Semaglutide,0.25 or 0.5MG /DOS, (OZEMPIC, 0.25 OR 0.5 MG/DOSE,) 2 MG/1.5ML SOPN Inject 0.5 mg into the skin once a week. 4.5 mL 1   Vitamin D, Ergocalciferol, (DRISDOL) 1.25 MG (50000 UNIT) CAPS capsule Take 1 capsule (50,000 Units total) by mouth every 7 (seven) days. 12 capsule 1   No current facility-administered medications for this visit.    Medication Side Effects: none  Orders placed this visit:  No orders of the defined types were placed in this encounter.   Psychiatric Specialty Exam:  Review of Systems  Musculoskeletal:  Negative for gait problem.  Neurological:  Negative for tremors.  Psychiatric/Behavioral:         Please refer to HPI   Blood pressure 130/75, pulse 80, height 5\' 7"  (1.702 m), weight 289 lb (131.1 kg), unknown if currently  breastfeeding.Body mass index is 45.26 kg/m.  General Appearance: Casual and Neat  Eye Contact:  Good  Speech:  Clear and Coherent and Normal Rate  Volume:  Normal  Mood:  Euthymic  Affect:  Appropriate and Congruent  Thought Process:  Coherent  Orientation:  Full (Time, Place, and Person)  Thought Content: Logical   Suicidal Thoughts:  No  Homicidal Thoughts:  No  Memory:  WNL  Judgement:  Good  Insight:  Good  Psychomotor Activity:  Normal  Concentration:  Concentration: Good  Recall:  Good  Fund of Knowledge: Good  Language: Good  Assets:  Communication Skills Desire for Improvement Financial Resources/Insurance Housing Intimacy Leisure Time Physical Health Resilience Social Support Talents/Skills Transportation Vocational/Educational  ADL's:  Intact  Cognition: WNL  Prognosis:  Good  Screenings:  [facility] Visit from 08/12/2020 in [facility] Visit from 08/12/2019 in [facility] Visit from 04/28/2019 in [facility] Visit from 03/18/2018 in [facility] Visit from 12/17/2017 in [facility]  PHQ-2 Total Score 0 0 0 2 0  PHQ-9 Total Score 0 1 0 7 5       Receiving Psychotherapy: No   Treatment Plan/Recommendations:   Plan:  PDMP reviewed  D/C Wellbutrin SR 150mg  every morning - taking 5 days a week. Add Lexapro 10mg  daily Add Adderall XR 20mg  daily    [facility] ADHD screening - 47/58 ADHD    Time spent with patient was 60 minutes. Greater than 50% of face to face time with patient was spent on counseling and coordination of care. ADHD screening completed.  RTC 4 weeks  Patient advised to contact office with any questions, adverse effects, or acute worsening in signs and symptoms.  Discussed potential benefits, risks, and side effects of stimulants with patient to include increased heart  rate, palpitations, insomnia, increased anxiety, increased irritability, or decreased appetite.  Instructed patient to contact office if experiencing any significant tolerability issues.       Aloha Gell, NP

## 2020-10-20 ENCOUNTER — Telehealth: Payer: Self-pay | Admitting: Adult Health

## 2020-10-20 NOTE — Telephone Encounter (Signed)
Pharmacy stated they need a PA

## 2020-10-20 NOTE — Telephone Encounter (Signed)
Pt called stating that the pharmacy stated they need some additional info from [place] regarding the Adderall script.

## 2020-10-21 NOTE — Telephone Encounter (Signed)
Noted working on a PA

## 2020-10-26 ENCOUNTER — Telehealth: Payer: Self-pay | Admitting: Adult Health

## 2020-10-26 NOTE — Telephone Encounter (Signed)
Pt checking status on Adderall XR PA. Contact Pt @ [phone]

## 2020-10-28 ENCOUNTER — Other Ambulatory Visit (HOSPITAL_COMMUNITY): Payer: Self-pay

## 2020-10-28 ENCOUNTER — Other Ambulatory Visit: Payer: Self-pay

## 2020-10-28 ENCOUNTER — Telehealth: Payer: Self-pay | Admitting: Adult Health

## 2020-10-28 DIAGNOSIS — F909 Attention-deficit hyperactivity disorder, unspecified type: Secondary | ICD-10-CM

## 2020-10-28 MED ORDER — AMPHETAMINE-DEXTROAMPHET ER 20 MG PO CP24
20.0000 mg | ORAL_CAPSULE | Freq: Every day | ORAL | 0 refills | Status: DC
  Filled 2020-10-28 – 2020-11-03 (×2): qty 30, 30d supply, fill #0

## 2020-10-28 NOTE — Telephone Encounter (Signed)
Cancelled.  

## 2020-10-28 NOTE — Telephone Encounter (Signed)
Please cancel rx at [facility] so I can pend to new pharmacy

## 2020-10-28 NOTE — Telephone Encounter (Signed)
Next visit is 11/15/20, Nainika called and said that  [facility] [place] [facility]'t have the Adderall 20 mg in stock. [facility] has it in stock.   [facility]  Phone:  [phone]  Fax:  [phone]

## 2020-10-28 NOTE — Telephone Encounter (Signed)
Pended.

## 2020-10-28 NOTE — Telephone Encounter (Signed)
Working on this. They wouldn't allow electronically sent so has to be called [employer]

## 2020-10-28 NOTE — Telephone Encounter (Signed)
PA has been submitted but I will need to contact [facility], for some reason they won't allow it to process electronically.   [place] ID# [id number]  [phone]

## 2020-10-29 ENCOUNTER — Other Ambulatory Visit (HOSPITAL_COMMUNITY): Payer: Self-pay

## 2020-10-29 NOTE — Telephone Encounter (Signed)
I advised pt of Traci needing to contact the ins company by phone since electronically there is some issue.  She said they told her by phone is fastest way and provided a phone number different than what Traci currently has. [phone].  You can also fax to [phone].   Next appt 11/7

## 2020-11-03 ENCOUNTER — Other Ambulatory Visit (HOSPITAL_COMMUNITY): Payer: Self-pay

## 2020-11-03 NOTE — Telephone Encounter (Signed)
Patient notified

## 2020-11-03 NOTE — Telephone Encounter (Signed)
Pt's prior authorization's are contracted with [employer] with [facility] @ [phone]. A verbal PA was completed on the phone and a fax will be sent with the determination within 24-72 hours. Pending PA# [id number]  Adderall XR 20 mg #30/30

## 2020-11-03 NOTE — Telephone Encounter (Signed)
Noted  

## 2020-11-03 NOTE — Telephone Encounter (Signed)
Prior Approval received for ADDERALL XR effective 11/03/2020-11/02/2023    Shelton Silvas, can you please let pt know.

## 2020-11-05 NOTE — Telephone Encounter (Signed)
Approved effective 11/03/2020-11/02/2023 with [employer] [phone] [id number] PA# [id number]

## 2020-11-15 ENCOUNTER — Encounter: Payer: Self-pay | Admitting: Adult Health

## 2020-11-15 ENCOUNTER — Other Ambulatory Visit: Payer: Self-pay

## 2020-11-15 ENCOUNTER — Ambulatory Visit (INDEPENDENT_AMBULATORY_CARE_PROVIDER_SITE_OTHER): Payer: 59 | Admitting: Adult Health

## 2020-11-15 ENCOUNTER — Other Ambulatory Visit (HOSPITAL_COMMUNITY): Payer: Self-pay

## 2020-11-15 DIAGNOSIS — F909 Attention-deficit hyperactivity disorder, unspecified type: Secondary | ICD-10-CM | POA: Diagnosis not present

## 2020-11-15 DIAGNOSIS — F411 Generalized anxiety disorder: Secondary | ICD-10-CM | POA: Diagnosis not present

## 2020-11-15 MED ORDER — ESCITALOPRAM OXALATE 10 MG PO TABS
10.0000 mg | ORAL_TABLET | Freq: Every day | ORAL | 2 refills | Status: DC
  Filled 2020-11-15 – 2021-01-12 (×3): qty 30, 30d supply, fill #0
  Filled 2021-02-14: qty 30, 30d supply, fill #1

## 2020-11-15 MED ORDER — AMPHETAMINE-DEXTROAMPHET ER 20 MG PO CP24
20.0000 mg | ORAL_CAPSULE | Freq: Two times a day (BID) | ORAL | 0 refills | Status: DC
  Filled 2020-11-15 – 2020-11-24 (×2): qty 60, 30d supply, fill #0

## 2020-11-15 NOTE — Progress Notes (Signed)
Rhonda Murray [id number] [birth year] 45 y.o.  Subjective:   Patient ID:  Rhonda Murray is a 45 y.o. (DOB January 06, 1976) female.  Chief Complaint: No chief complaint on file.   HPI Rhonda Murray presents to the office today for follow-up of ADHD and GAD.  Describes mood today as "ok". Pleasant. Mood symptoms reports - depression - "off and on - some sad moments", anxiety - "eased up a lot", and irritability - "not lashing out as much". Decreased worry and rumination at bedtime. Decreased anxiety when driving. Feels like the addition of the Lexapro and Adderall have been helpful. Stable interest and motivation. Taking medications as prescribed. Energy levels stable. Active, does not have a regular exercise routine.  Enjoys some usual interests and activities. Single, has a significant other. Has a son age 74 and a daughter age 29. Spending time with family. Appetite adequate. Weight stable - 289 pounds. Sleeps well most nights. Averages 5 to 6 hours. Focus and concentration better. Completing tasks. Managing aspects of household. Graduated from [employer]. Works in Insurance claims handler. Has applied for a different job with different hours. Denies SI or HI.  Denies AH or VH.  Previous medication trials:  Wellbutrin, Zoloft   PHQ2-9    Flowsheet Row Office Visit from 08/12/2020 in [place] Visit from 08/12/2019 in [place] Visit from 04/28/2019 in [place] from 03/18/2018 in [place] Visit from 12/17/2017 in [facility]  PHQ-2 Total Score 0 0 0 2 0  PHQ-9 Total Score 0 1 0 7 5        Review of Systems:  Review of Systems  Musculoskeletal:  Negative for gait problem.  Neurological:  Negative for tremors.  Psychiatric/Behavioral:         Please refer to HPI   Medications: I have reviewed the patient's current medications.  Current Outpatient  Medications  Medication Sig Dispense Refill   albuterol (VENTOLIN HFA) 108 (90 Base) MCG/ACT inhaler Inhale 2 puffs into the lungs every 6 (six) hours as needed for wheezing or shortness of breath. 8 g 2   amphetamine-dextroamphetamine (ADDERALL XR) 20 MG 24 hr capsule Take 1 capsule (20 mg total) by mouth 2 (two) times daily. 60 capsule 0   escitalopram (LEXAPRO) 10 MG tablet Take 1 tablet (10 mg total) by mouth daily. 30 tablet 2   ibuprofen (ADVIL) 800 MG tablet Take 1 tablet (800 mg total) by mouth every 8 (eight) hours as needed. (Patient taking differently: Take 800 mg by mouth every 8 (eight) hours as needed for moderate pain.) 30 tablet 0   Insulin Pen Needle 32G X 6 MM MISC 1 each by Does not apply route daily. 100 each 1   metFORMIN (GLUCOPHAGE) 500 MG tablet TAKE 1 TABLET (500 MG TOTAL) BY MOUTH 2 (TWO) TIMES DAILY WITH A MEAL. 180 tablet 1   Semaglutide,0.25 or 0.5MG /DOS, (OZEMPIC, 0.25 OR 0.5 MG/DOSE,) 2 MG/1.5ML SOPN Inject 0.5 mg into the skin once a week. 4.5 mL 1   Vitamin D, Ergocalciferol, (DRISDOL) 1.25 MG (50000 UNIT) CAPS capsule Take 1 capsule (50,000 Units total) by mouth every 7 (seven) days. 12 capsule 1   No current facility-administered medications for this visit.    Medication Side Effects: None  Allergies: No Known Allergies  Past Medical History:  Diagnosis Date   Anxiety unknown   COVID-19 virus infection 09/2019   Morbid obesity with BMI of 45.0-49.9, adult ([place])  Sleep apnea     Past Medical History, Surgical history, Social history, and Family history were reviewed and updated as appropriate.   Please see review of systems for further details on the patient's review from today.   Objective:   Physical Exam:  There were no vitals taken for this visit.  Physical Exam Constitutional:      General: She is not in acute distress. Musculoskeletal:        General: No deformity.  Neurological:     Mental Status: She is alert and oriented to person,  place, and time.     Coordination: Coordination normal.  Psychiatric:        Attention and Perception: Attention and perception normal. She does not perceive auditory or visual hallucinations.        Mood and Affect: Mood normal. Mood is not anxious or depressed. Affect is not labile, blunt, angry or inappropriate.        Speech: Speech normal.        Behavior: Behavior normal.        Thought Content: Thought content normal. Thought content is not paranoid or delusional. Thought content does not include homicidal or suicidal ideation. Thought content does not include homicidal or suicidal plan.        Cognition and Memory: Cognition and memory normal.        Judgment: Judgment normal.     Comments: Insight intact    Lab Review:     Component Value Date/Time   NA 141 08/12/2020 1607   K 3.9 08/12/2020 1607   CL 102 08/12/2020 1607   CO2 25 08/12/2020 1607   GLUCOSE 90 08/12/2020 1607   GLUCOSE 95 09/18/2019 2131   BUN 8 08/12/2020 1607   CREATININE 1.02 (H) 08/12/2020 1607   CALCIUM 9.3 08/12/2020 1607   PROT 7.4 06/09/2020 1616   ALBUMIN 3.9 06/09/2020 1616   AST 23 06/09/2020 1616   ALT 31 06/09/2020 1616   ALKPHOS 111 06/09/2020 1616   BILITOT 0.4 06/09/2020 1616   GFRNONAA >60 09/18/2019 2131   GFRAA >60 09/18/2019 2131       Component Value Date/Time   WBC 11.3 (H) 06/09/2020 1616   WBC 7.2 09/18/2019 2131   RBC 4.00 06/09/2020 1616   RBC 4.73 09/18/2019 2131   HGB 12.6 06/09/2020 1616   HCT 37.3 06/09/2020 1616   PLT 327 06/09/2020 1616   MCV 93 06/09/2020 1616   MCH 31.5 06/09/2020 1616   MCH 30.7 09/18/2019 2131   MCHC 33.8 06/09/2020 1616   MCHC 33.2 09/18/2019 2131   RDW 12.1 06/09/2020 1616   LYMPHSABS 2.6 09/18/2019 2131   MONOABS 0.5 09/18/2019 2131   EOSABS 0.1 09/18/2019 2131   BASOSABS 0.0 09/18/2019 2131    No results found for: POCLITH, LITHIUM   No results found for: PHENYTOIN, PHENOBARB, VALPROATE, CBMZ   .res Assessment: Plan:      Plan:  PDMP reviewed  Continue Lexapro 10mg  daily Increase Adderall XR 20mg  daily to BID   [facility] ADHD screening - 47/58 ADHD   RTC 4 weeks   Time spent with patient was 20 minutes. Greater than 50% of face to face time with patient was spent on counseling and coordination of care.  Patient advised to contact office with any questions, adverse effects, or acute worsening in signs and symptoms.  Discussed potential benefits, risks, and side effects of stimulants with patient to include increased heart rate, palpitations, insomnia, increased anxiety, increased irritability, or  decreased appetite.  Instructed patient to contact office if experiencing any significant tolerability issues.   Diagnoses and all orders for this visit:  Attention deficit hyperactivity disorder (ADHD), unspecified ADHD type -     amphetamine-dextroamphetamine (ADDERALL XR) 20 MG 24 hr capsule; Take 1 capsule (20 mg total) by mouth 2 (two) times daily.  Generalized anxiety disorder -     escitalopram (LEXAPRO) 10 MG tablet; Take 1 tablet (10 mg total) by mouth daily.    Please see After Visit Summary for patient specific instructions.  Future Appointments  Date Time Provider [place]  12/13/2020 10:20 AM Krishana Lutze, Berdie Ogren, NP [facility] None  06/13/2021  2:00 PM [name], FNP [facility] None    No orders of the defined types were placed in this encounter.   -------------------------------

## 2020-11-23 ENCOUNTER — Other Ambulatory Visit (HOSPITAL_COMMUNITY): Payer: Self-pay

## 2020-11-24 ENCOUNTER — Other Ambulatory Visit (HOSPITAL_COMMUNITY): Payer: Self-pay

## 2020-12-04 ENCOUNTER — Other Ambulatory Visit: Payer: Self-pay | Admitting: Nurse Practitioner

## 2020-12-04 DIAGNOSIS — F32A Depression, unspecified: Secondary | ICD-10-CM

## 2020-12-13 ENCOUNTER — Encounter: Payer: Self-pay | Admitting: Adult Health

## 2020-12-13 ENCOUNTER — Other Ambulatory Visit: Payer: Self-pay

## 2020-12-13 ENCOUNTER — Ambulatory Visit (INDEPENDENT_AMBULATORY_CARE_PROVIDER_SITE_OTHER): Payer: 59 | Admitting: Adult Health

## 2020-12-13 DIAGNOSIS — F909 Attention-deficit hyperactivity disorder, unspecified type: Secondary | ICD-10-CM | POA: Diagnosis not present

## 2020-12-13 DIAGNOSIS — F411 Generalized anxiety disorder: Secondary | ICD-10-CM

## 2020-12-13 NOTE — Progress Notes (Signed)
Rhonda Murray [id number] 10-26-1975 45 y.o.  Subjective:   Patient ID:  Rhonda Murray is a 44 y.o. (DOB January 24, 1975) female.  Chief Complaint: No chief complaint on file.   HPI Rhonda Murray presents to the office today for follow-up of ADHD and GAD.  Describes mood today as "ok". Pleasant. Mood symptoms reports - depression - "I have my moments, better", anxiety - "a little bit when stressed", and irritability - "at times". Decreased worry and rumination. Some anxiety when driving - "not as bad". Feels like medications - Lexapro and Adderall are working well. Stable interest and motivation. Taking medications as prescribed Energy levels stable. Active, does not have a regular exercise routine.  Enjoys some usual interests and activities. Single, has a significant other. Has a son age 59 and a daughter age 25. Spending time with family. Appetite adequate. Weight loss - 5 pounds - 284 pounds. Sleeps well most nights. Averages 5 to 6 hours. Focus and concentration stable. Completing tasks. Managing aspects of household. Graduated from [employer]. Works in Insurance claims handler.   Denies SI or HI.  Denies AH or VH.  Previous medication trials:  Wellbutrin, Zoloft   PHQ2-9    Flowsheet Row Office Visit from 08/12/2020 in [place] Visit from 08/12/2019 in [place] Visit from 04/28/2019 in [place] from 03/18/2018 in [place] Visit from 12/17/2017 in [facility]  PHQ-2 Total Score 0 0 0 2 0  PHQ-9 Total Score 0 1 0 7 5        Review of Systems:  Review of Systems  Musculoskeletal:  Negative for gait problem.  Neurological:  Negative for tremors.  Psychiatric/Behavioral:         Please refer to HPI   Medications: I have reviewed the patient's current medications.  Current Outpatient Medications  Medication Sig Dispense Refill   albuterol  (VENTOLIN HFA) 108 (90 Base) MCG/ACT inhaler Inhale 2 puffs into the lungs every 6 (six) hours as needed for wheezing or shortness of breath. 8 g 2   amphetamine-dextroamphetamine (ADDERALL XR) 20 MG 24 hr capsule Take 1 capsule (20 mg total) by mouth 2 (two) times daily. 60 capsule 0   escitalopram (LEXAPRO) 10 MG tablet Take 1 tablet (10 mg total) by mouth daily. 30 tablet 2   ibuprofen (ADVIL) 800 MG tablet Take 1 tablet (800 mg total) by mouth every 8 (eight) hours as needed. (Patient taking differently: Take 800 mg by mouth every 8 (eight) hours as needed for moderate pain.) 30 tablet 0   Insulin Pen Needle 32G X 6 MM MISC 1 each by Does not apply route daily. 100 each 1   metFORMIN (GLUCOPHAGE) 500 MG tablet TAKE 1 TABLET (500 MG TOTAL) BY MOUTH 2 (TWO) TIMES DAILY WITH A MEAL. 180 tablet 1   Semaglutide,0.25 or 0.5MG /DOS, (OZEMPIC, 0.25 OR 0.5 MG/DOSE,) 2 MG/1.5ML SOPN Inject 0.5 mg into the skin once a week. 4.5 mL 1   Vitamin D, Ergocalciferol, (DRISDOL) 1.25 MG (50000 UNIT) CAPS capsule Take 1 capsule (50,000 Units total) by mouth every 7 (seven) days. 12 capsule 1   No current facility-administered medications for this visit.    Medication Side Effects: None  Allergies: No Known Allergies  Past Medical History:  Diagnosis Date   Anxiety unknown   COVID-19 virus infection 09/2019   Morbid obesity with BMI of 45.0-49.9, adult ([facility])    Sleep apnea  Past Medical History, Surgical history, Social history, and Family history were reviewed and updated as appropriate.   Please see review of systems for further details on the patient's review from today.   Objective:   Physical Exam:  There were no vitals taken for this visit.  Physical Exam Constitutional:      General: She is not in acute distress. Musculoskeletal:        General: No deformity.  Neurological:     Mental Status: She is alert and oriented to person, place, and time.     Coordination: Coordination normal.   Psychiatric:        Attention and Perception: Attention and perception normal. She does not perceive auditory or visual hallucinations.        Mood and Affect: Mood normal. Mood is not anxious or depressed. Affect is not labile, blunt, angry or inappropriate.        Speech: Speech normal.        Behavior: Behavior normal.        Thought Content: Thought content normal. Thought content is not paranoid or delusional. Thought content does not include homicidal or suicidal ideation. Thought content does not include homicidal or suicidal plan.        Cognition and Memory: Cognition and memory normal.        Judgment: Judgment normal.     Comments: Insight intact    Lab Review:     Component Value Date/Time   NA 141 08/12/2020 1607   K 3.9 08/12/2020 1607   CL 102 08/12/2020 1607   CO2 25 08/12/2020 1607   GLUCOSE 90 08/12/2020 1607   GLUCOSE 95 09/18/2019 2131   BUN 8 08/12/2020 1607   CREATININE 1.02 (H) 08/12/2020 1607   CALCIUM 9.3 08/12/2020 1607   PROT 7.4 06/09/2020 1616   ALBUMIN 3.9 06/09/2020 1616   AST 23 06/09/2020 1616   ALT 31 06/09/2020 1616   ALKPHOS 111 06/09/2020 1616   BILITOT 0.4 06/09/2020 1616   GFRNONAA >60 09/18/2019 2131   GFRAA >60 09/18/2019 2131       Component Value Date/Time   WBC 11.3 (H) 06/09/2020 1616   WBC 7.2 09/18/2019 2131   RBC 4.00 06/09/2020 1616   RBC 4.73 09/18/2019 2131   HGB 12.6 06/09/2020 1616   HCT 37.3 06/09/2020 1616   PLT 327 06/09/2020 1616   MCV 93 06/09/2020 1616   MCH 31.5 06/09/2020 1616   MCH 30.7 09/18/2019 2131   MCHC 33.8 06/09/2020 1616   MCHC 33.2 09/18/2019 2131   RDW 12.1 06/09/2020 1616   LYMPHSABS 2.6 09/18/2019 2131   MONOABS 0.5 09/18/2019 2131   EOSABS 0.1 09/18/2019 2131   BASOSABS 0.0 09/18/2019 2131    No results found for: POCLITH, LITHIUM   No results found for: PHENYTOIN, PHENOBARB, VALPROATE, CBMZ   .res Assessment: Plan:    Plan:  PDMP reviewed  Continue Lexapro 10mg   daily Adderall XR 20mg  daily to BID   [facility] ADHD screening - 47/58 ADHD   RTC 4 weeks  Time spent with patient was 20 minutes. Greater than 50% of face to face time with patient was spent on counseling and coordination of care.  Patient advised to contact office with any questions, adverse effects, or acute worsening in signs and symptoms.  Discussed potential benefits, risks, and side effects of stimulants with patient to include increased heart rate, palpitations, insomnia, increased anxiety, increased irritability, or decreased appetite.  Instructed patient to contact office if  experiencing any significant tolerability issues.   There are no diagnoses linked to this encounter.   Please see After Visit Summary for patient specific instructions.  Future Appointments  Date Time Provider [place]  06/13/2021  2:00 PM [name], FNP [facility] None    No orders of the defined types were placed in this encounter.   -------------------------------

## 2020-12-28 ENCOUNTER — Other Ambulatory Visit (HOSPITAL_COMMUNITY): Payer: Self-pay

## 2020-12-30 ENCOUNTER — Other Ambulatory Visit: Payer: Self-pay | Admitting: Adult Health

## 2020-12-30 ENCOUNTER — Other Ambulatory Visit (HOSPITAL_COMMUNITY): Payer: Self-pay

## 2020-12-30 DIAGNOSIS — F909 Attention-deficit hyperactivity disorder, unspecified type: Secondary | ICD-10-CM

## 2020-12-30 MED ORDER — AMPHETAMINE-DEXTROAMPHET ER 20 MG PO CP24
20.0000 mg | ORAL_CAPSULE | Freq: Two times a day (BID) | ORAL | 0 refills | Status: DC
Start: 2021-01-27 — End: 2021-04-26

## 2020-12-30 MED ORDER — AMPHETAMINE-DEXTROAMPHET ER 20 MG PO CP24: 20.0000 mg | ORAL_CAPSULE | Freq: Two times a day (BID) | ORAL | 0 refills | Status: DC

## 2021-01-12 ENCOUNTER — Other Ambulatory Visit (HOSPITAL_COMMUNITY): Payer: Self-pay

## 2021-02-07 ENCOUNTER — Telehealth: Payer: Self-pay | Admitting: Adult Health

## 2021-02-07 NOTE — Telephone Encounter (Signed)
Pt has refill on file

## 2021-02-07 NOTE — Telephone Encounter (Signed)
Patient called in for refill on Adderall 20mg . Ph: [phone]. Appt 3/6. Pharmacy [facility] [place]

## 2021-02-14 ENCOUNTER — Other Ambulatory Visit (HOSPITAL_COMMUNITY): Payer: Self-pay

## 2021-03-14 ENCOUNTER — Other Ambulatory Visit: Payer: Self-pay

## 2021-03-14 ENCOUNTER — Encounter: Payer: Self-pay | Admitting: Adult Health

## 2021-03-14 ENCOUNTER — Ambulatory Visit (INDEPENDENT_AMBULATORY_CARE_PROVIDER_SITE_OTHER): Payer: 59 | Admitting: Adult Health

## 2021-03-14 DIAGNOSIS — F909 Attention-deficit hyperactivity disorder, unspecified type: Secondary | ICD-10-CM | POA: Diagnosis not present

## 2021-03-14 DIAGNOSIS — F411 Generalized anxiety disorder: Secondary | ICD-10-CM | POA: Diagnosis not present

## 2021-03-14 MED ORDER — ESCITALOPRAM OXALATE 20 MG PO TABS: 20.0000 mg | ORAL_TABLET | Freq: Every day | ORAL | 2 refills | Status: DC

## 2021-03-14 MED ORDER — AMPHETAMINE-DEXTROAMPHET ER 20 MG PO CP24: 20.0000 mg | ORAL_CAPSULE | Freq: Two times a day (BID) | ORAL | 0 refills | Status: DC

## 2021-03-14 NOTE — Progress Notes (Signed)
[name] ?[id number] ?[birth year] ?46 y.o. ? ?Subjective:  ? ?Patient ID:  Rhonda Murray is a 46 y.o. (DOB 22-Jun-1975) female. ? ?Chief Complaint: No chief complaint on file. ? ? ?HPI ?Rhonda Murray presents to the office today for follow-up of ADHD and GAD. ? ?Describes mood today as "ok". Pleasant. Mood symptoms denies depression. Irritable at times. Feels more anxious overall. Decreased worry and rumination - "a little bit". Stating "I'm feeling more anxious and stressed". Picking at skin more. Daughter getting over waking PNA. Trying to get son ready for high school. Some anxiety when driving - "not as bad". Would like to increase Lexapro. Stable interest and motivation. Taking medications as prescribed. ?Energy levels up and down. Active, does not have a regular exercise routine.  ?Enjoys some usual interests and activities. Single, has a significant other. Has a son age 49 and a daughter age 71. Spending time with family. ?Appetite adequate. Weight stable - 284 pounds. ?Sleeps well most nights. Averages 5 to 6 hours. ?Focus and concentration "getting there". Completing tasks. Managing aspects of household. Works in Insurance claims handler.   ?Denies SI or HI.  ?Denies AH or VH. ? ?Previous medication trials:  Wellbutrin, Zoloft ? ? ?PHQ2-9   ? ?[place] Office Visit from 08/12/2020 in [place] Visit from 08/12/2019 in [place] Visit from 04/28/2019 in [place] Visit from 03/18/2018 in [place] Visit from 12/17/2017 in [place]  ?PHQ-2 Total Score 0 0 0 2 0  ?PHQ-9 Total Score 0 1 0 7 5  ? ?  ?  ? ?Review of Systems:  ?Review of Systems  ?Musculoskeletal:  Negative for gait problem.  ?Neurological:  Negative for tremors.  ?Psychiatric/Behavioral:    ?     Please refer to HPI  ? ?Medications: I have reviewed the patient's current medications. ? ?Current Outpatient  Medications  ?Medication Sig Dispense Refill  ? albuterol (VENTOLIN HFA) 108 (90 Base) MCG/ACT inhaler Inhale 2 puffs into the lungs every 6 (six) hours as needed for wheezing or shortness of breath. 8 g 2  ? amphetamine-dextroamphetamine (ADDERALL XR) 20 MG 24 hr capsule Take 1 capsule (20 mg total) by mouth 2 (two) times daily. 60 capsule 0  ? amphetamine-dextroamphetamine (ADDERALL XR) 20 MG 24 hr capsule Take 1 capsule (20 mg total) by mouth 2 (two) times daily. 60 capsule 0  ? amphetamine-dextroamphetamine (ADDERALL XR) 20 MG 24 hr capsule Take 1 capsule (20 mg total) by mouth 2 (two) times daily. 60 capsule 0  ? escitalopram (LEXAPRO) 20 MG tablet Take 1 tablet (20 mg total) by mouth daily. 30 tablet 2  ? ibuprofen (ADVIL) 800 MG tablet Take 1 tablet (800 mg total) by mouth every 8 (eight) hours as needed. (Patient taking differently: Take 800 mg by mouth every 8 (eight) hours as needed for moderate pain.) 30 tablet 0  ? Insulin Pen Needle 32G X 6 MM MISC 1 each by Does not apply route daily. 100 each 1  ? metFORMIN (GLUCOPHAGE) 500 MG tablet TAKE 1 TABLET (500 MG TOTAL) BY MOUTH 2 (TWO) TIMES DAILY WITH A MEAL. 180 tablet 1  ? Semaglutide,0.25 or 0.'5MG'$ /DOS, (OZEMPIC, 0.25 OR 0.5 MG/DOSE,) 2 MG/1.5ML SOPN Inject 0.5 mg into the skin once a week. 4.5 mL 1  ? Vitamin D, Ergocalciferol, (DRISDOL) 1.25 MG (50000 UNIT) CAPS capsule Take 1 capsule (50,000 Units total) by mouth every 7 (seven) days. 12 capsule  1  ? ?No current facility-administered medications for this visit.  ? ? ?Medication Side Effects: None ? ?Allergies: No Known Allergies ? ?Past Medical History:  ?Diagnosis Date  ? Anxiety unknown  ? COVID-19 virus infection 09/2019  ? Morbid obesity with BMI of 45.0-49.9, adult ([place])   ? Sleep apnea   ? ? ?Past Medical History, Surgical history, Social history, and Family history were reviewed and updated as appropriate.  ? ?Please see review of systems for further details on the patient's review from today.   ? ?Objective:  ? ?Physical Exam:  ?There were no vitals taken for this visit. ? ?Physical Exam ?Constitutional:   ?   General: She is not in acute distress. ?Musculoskeletal:     ?   General: No deformity.  ?Neurological:  ?   Mental Status: She is alert and oriented to person, place, and time.  ?   Coordination: Coordination normal.  ?Psychiatric:     ?   Attention and Perception: Attention and perception normal. She does not perceive auditory or visual hallucinations.     ?   Mood and Affect: Mood normal. Mood is not anxious or depressed. Affect is not labile, blunt, angry or inappropriate.     ?   Speech: Speech normal.     ?   Behavior: Behavior normal.     ?   Thought Content: Thought content normal. Thought content is not paranoid or delusional. Thought content does not include homicidal or suicidal ideation. Thought content does not include homicidal or suicidal plan.     ?   Cognition and Memory: Cognition and memory normal.     ?   Judgment: Judgment normal.  ?   Comments: Insight intact  ? ? ?Lab Review:  ?   ?Component Value Date/Time  ? NA 141 08/12/2020 1607  ? K 3.9 08/12/2020 1607  ? CL 102 08/12/2020 1607  ? CO2 25 08/12/2020 1607  ? GLUCOSE 90 08/12/2020 1607  ? GLUCOSE 95 09/18/2019 2131  ? BUN 8 08/12/2020 1607  ? CREATININE 1.02 (H) 08/12/2020 1607  ? CALCIUM 9.3 08/12/2020 1607  ? PROT 7.4 06/09/2020 1616  ? ALBUMIN 3.9 06/09/2020 1616  ? AST 23 06/09/2020 1616  ? ALT 31 06/09/2020 1616  ? ALKPHOS 111 06/09/2020 1616  ? BILITOT 0.4 06/09/2020 1616  ? GFRNONAA >60 09/18/2019 2131  ? GFRAA >60 09/18/2019 2131  ? ? ?   ?Component Value Date/Time  ? WBC 11.3 (H) 06/09/2020 1616  ? WBC 7.2 09/18/2019 2131  ? RBC 4.00 06/09/2020 1616  ? RBC 4.73 09/18/2019 2131  ? HGB 12.6 06/09/2020 1616  ? HCT 37.3 06/09/2020 1616  ? PLT 327 06/09/2020 1616  ? MCV 93 06/09/2020 1616  ? MCH 31.5 06/09/2020 1616  ? MCH 30.7 09/18/2019 2131  ? MCHC 33.8 06/09/2020 1616  ? MCHC 33.2 09/18/2019 2131  ? RDW 12.1  06/09/2020 1616  ? LYMPHSABS 2.6 09/18/2019 2131  ? MONOABS 0.5 09/18/2019 2131  ? EOSABS 0.1 09/18/2019 2131  ? BASOSABS 0.0 09/18/2019 2131  ? ? ?No results found for: POCLITH, LITHIUM  ? ?No results found for: PHENYTOIN, PHENOBARB, VALPROATE, CBMZ  ? ?.res ?Assessment: Plan:   ? ?Plan: ? ?PDMP reviewed ? ?Increase Lexapro '10mg'$  to '20mg'$  daily ?Adderall XR '20mg'$  daily to BID ?  ?[facility] ADHD screening - 47/58 ADHD  ? ?RTC 3 months ? ?Time spent with patient was 20 minutes. Greater than 50% of face to face time with  patient was spent on counseling and coordination of care. ? ?Patient advised to contact office with any questions, adverse effects, or acute worsening in signs and symptoms. ? ?Discussed potential benefits, risks, and side effects of stimulants with patient to include increased heart rate, palpitations, insomnia, increased anxiety, increased irritability, or decreased appetite.  Instructed patient to contact office if experiencing any significant tolerability issues.  ? ?Diagnoses and all orders for this visit: ? ?Attention deficit hyperactivity disorder (ADHD), unspecified ADHD type ?-     amphetamine-dextroamphetamine (ADDERALL XR) 20 MG 24 hr capsule; Take 1 capsule (20 mg total) by mouth 2 (two) times daily. ? ?Generalized anxiety disorder ?-     escitalopram (LEXAPRO) 20 MG tablet; Take 1 tablet (20 mg total) by mouth daily. ? ?  ? ?Please see After Visit Summary for patient specific instructions. ? ?Future Appointments  ?Date Time Provider [place]  ?06/13/2021  2:00 PM [name], FNP [facility] None  ? ? ?No orders of the defined types were placed in this encounter. ? ? ?------------------------------- ?

## 2021-04-26 ENCOUNTER — Other Ambulatory Visit: Payer: Self-pay | Admitting: Adult Health

## 2021-04-26 ENCOUNTER — Telehealth: Payer: Self-pay | Admitting: Adult Health

## 2021-04-26 DIAGNOSIS — F909 Attention-deficit hyperactivity disorder, unspecified type: Secondary | ICD-10-CM

## 2021-04-26 MED ORDER — AMPHETAMINE-DEXTROAMPHET ER 20 MG PO CP24: 20.0000 mg | ORAL_CAPSULE | Freq: Two times a day (BID) | ORAL | 0 refills | Status: DC

## 2021-04-26 NOTE — Telephone Encounter (Signed)
Script sent  

## 2021-04-26 NOTE — Telephone Encounter (Signed)
Pt called in to ask for her RF on Adderall . Please send it in to [facility] on [address]. ?[address] ?

## 2021-06-01 ENCOUNTER — Telehealth: Payer: Self-pay | Admitting: Adult Health

## 2021-06-02 ENCOUNTER — Other Ambulatory Visit: Payer: Self-pay | Admitting: Adult Health

## 2021-06-02 DIAGNOSIS — F909 Attention-deficit hyperactivity disorder, unspecified type: Secondary | ICD-10-CM

## 2021-06-02 MED ORDER — AMPHETAMINE-DEXTROAMPHET ER 20 MG PO CP24: 20.0000 mg | ORAL_CAPSULE | Freq: Two times a day (BID) | ORAL | 0 refills | Status: DC

## 2021-06-02 NOTE — Telephone Encounter (Signed)
Next visit is 06/13/21. Rhonda Murray is requesting a refill on her Adderall XR 20 mg capsules. Please call to:  [place], [place] - [address]  Phone:  [phone]  Fax:  [phone]   In stock at this location.

## 2021-06-02 NOTE — Telephone Encounter (Signed)
Script sent  

## 2021-06-13 ENCOUNTER — Encounter: Payer: Self-pay | Admitting: Adult Health

## 2021-06-13 ENCOUNTER — Ambulatory Visit (INDEPENDENT_AMBULATORY_CARE_PROVIDER_SITE_OTHER): Payer: 59 | Admitting: Adult Health

## 2021-06-13 ENCOUNTER — Encounter: Payer: 59 | Admitting: Nurse Practitioner

## 2021-06-13 DIAGNOSIS — F411 Generalized anxiety disorder: Secondary | ICD-10-CM

## 2021-06-13 DIAGNOSIS — F909 Attention-deficit hyperactivity disorder, unspecified type: Secondary | ICD-10-CM | POA: Diagnosis not present

## 2021-06-13 MED ORDER — ESCITALOPRAM OXALATE 20 MG PO TABS: 20.0000 mg | ORAL_TABLET | Freq: Every day | ORAL | 2 refills | Status: DC

## 2021-06-13 MED ORDER — AMPHETAMINE-DEXTROAMPHET ER 20 MG PO CP24: 20.0000 mg | ORAL_CAPSULE | Freq: Two times a day (BID) | ORAL | 0 refills | Status: DC

## 2021-06-13 NOTE — Progress Notes (Signed)
Rhonda Murray [id number] 19-Feb-1975 46 y.o.  Subjective:   Patient ID:  Rhonda Murray is a 46 y.o. (DOB 11-Jan-1975) female.  Chief Complaint: No chief complaint on file.   HPI Rhonda Murray presents to the office today for follow-up of ADHD and GAD.  Describes mood today as "ok". Pleasant. Mood symptoms - reports a "little" depression.Feels anxious "off and on".   Irritable at times. Reports decreased worry and rumination - "more recently". She and significant other are going through a break up. Trying to figure out what she is going to do moving [place]row[place] - planning to move out. Concerned about moving her children. Has started picking at skin more - hand. Has not been taking Lexapro consistently, but plans to restart it . Stable interest and motivation. Taking medications as prescribed. Energy levels varies day to day. Active, does not have a regular exercise routine.  Enjoys some usual interests and activities. Single, she and significant other have ended relationship, but are still living together. Has a son age 47 and a daughter age 2. Spending time with family. Appetite adequate. Weight stable - 284 pounds. Sleeps well most nights. Averages 5 to 6 hours. Focus and concentration improved - still getting distracted at times. Completing tasks. Managing aspects of household. Works in Insurance claims handler.   Denies SI or HI.  Denies AH or VH.  Previous medication trials:  Wellbutrin, Zoloft   PHQ2-9    Flowsheet Row Office Visit from 08/12/2020 in [place] Visit from 08/12/2019 in [place] Visit from 04/28/2019 in [place] from 03/18/2018 in [place] Visit from 12/17/2017 in [facility]  PHQ-2 Total Score 0 0 0 2 0  PHQ-9 Total Score 0 1 0 7 5        Review of Systems:  Review of Systems  Musculoskeletal:  Negative for gait problem.   Neurological:  Negative for tremors.  Psychiatric/Behavioral:         Please refer to HPI   Medications: I have reviewed the patient's current medications.  Current Outpatient Medications  Medication Sig Dispense Refill   albuterol (VENTOLIN HFA) 108 (90 Base) MCG/ACT inhaler Inhale 2 puffs into the lungs every 6 (six) hours as needed for wheezing or shortness of breath. 8 g 2   amphetamine-dextroamphetamine (ADDERALL XR) 20 MG 24 hr capsule Take 1 capsule (20 mg total) by mouth 2 (two) times daily. 60 capsule 0   [START ON 07/11/2021] amphetamine-dextroamphetamine (ADDERALL XR) 20 MG 24 hr capsule Take 1 capsule (20 mg total) by mouth 2 (two) times daily. 60 capsule 0   [START ON 08/08/2021] amphetamine-dextroamphetamine (ADDERALL XR) 20 MG 24 hr capsule Take 1 capsule (20 mg total) by mouth 2 (two) times daily. 60 capsule 0   escitalopram (LEXAPRO) 20 MG tablet Take 1 tablet (20 mg total) by mouth daily. 30 tablet 2   Insulin Pen Needle 32G X 6 MM MISC 1 each by Does not apply route daily. 100 each 1   metFORMIN (GLUCOPHAGE) 500 MG tablet TAKE 1 TABLET (500 MG TOTAL) BY MOUTH 2 (TWO) TIMES DAILY WITH A MEAL. 180 tablet 1   Semaglutide,0.25 or 0.'5MG'$ /DOS, (OZEMPIC, 0.25 OR 0.5 MG/DOSE,) 2 MG/1.5ML SOPN Inject 0.5 mg into the skin once a week. 4.5 mL 1   Vitamin D, Ergocalciferol, (DRISDOL) 1.25 MG (50000 UNIT) CAPS capsule Take 1 capsule (50,000 Units total) by mouth every 7 (seven) days. 12 capsule 1  No current facility-administered medications for this visit.    Medication Side Effects: None  Allergies: No Known Allergies  Past Medical History:  Diagnosis Date   Anxiety unknown   COVID-19 virus infection 09/2019   Morbid obesity with BMI of 45.0-49.9, adult [facility])    Sleep apnea     Past Medical History, Surgical history, Social history, and Family history were reviewed and updated as appropriate.   Please see review of systems for further details on the patient's review from  today.   Objective:   Physical Exam:  There were no vitals taken for this visit.  Physical Exam Constitutional:      General: She is not in acute distress. Musculoskeletal:        General: No deformity.  Neurological:     Mental Status: She is alert and oriented to person, place, and time.     Coordination: Coordination normal.  Psychiatric:        Attention and Perception: Attention and perception normal. She does not perceive auditory or visual hallucinations.        Mood and Affect: Mood normal. Mood is not anxious or depressed. Affect is not labile, blunt, angry or inappropriate.        Speech: Speech normal.        Behavior: Behavior normal.        Thought Content: Thought content normal. Thought content is not paranoid or delusional. Thought content does not include homicidal or suicidal ideation. Thought content does not include homicidal or suicidal plan.        Cognition and Memory: Cognition and memory normal.        Judgment: Judgment normal.     Comments: Insight intact    Lab Review:     Component Value Date/Time   NA 141 08/12/2020 1607   K 3.9 08/12/2020 1607   CL 102 08/12/2020 1607   CO2 25 08/12/2020 1607   GLUCOSE 90 08/12/2020 1607   GLUCOSE 95 09/18/2019 2131   BUN 8 08/12/2020 1607   CREATININE 1.02 (H) 08/12/2020 1607   CALCIUM 9.3 08/12/2020 1607   PROT 7.4 06/09/2020 1616   ALBUMIN 3.9 06/09/2020 1616   AST 23 06/09/2020 1616   ALT 31 06/09/2020 1616   ALKPHOS 111 06/09/2020 1616   BILITOT 0.4 06/09/2020 1616   GFRNONAA >60 09/18/2019 2131   GFRAA >60 09/18/2019 2131       Component Value Date/Time   WBC 11.3 (H) 06/09/2020 1616   WBC 7.2 09/18/2019 2131   RBC 4.00 06/09/2020 1616   RBC 4.73 09/18/2019 2131   HGB 12.6 06/09/2020 1616   HCT 37.3 06/09/2020 1616   PLT 327 06/09/2020 1616   MCV 93 06/09/2020 1616   MCH 31.5 06/09/2020 1616   MCH 30.7 09/18/2019 2131   MCHC 33.8 06/09/2020 1616   MCHC 33.2 09/18/2019 2131   RDW 12.1  06/09/2020 1616   LYMPHSABS 2.6 09/18/2019 2131   MONOABS 0.5 09/18/2019 2131   EOSABS 0.1 09/18/2019 2131   BASOSABS 0.0 09/18/2019 2131    No results found for: POCLITH, LITHIUM   No results found for: PHENYTOIN, PHENOBARB, VALPROATE, CBMZ   .res Assessment: Plan:    Plan:  PDMP reviewed  Lexapro '20mg'$  daily Adderall XR '20mg'$  BID   [facility] ADHD screening - 47/58 ADHD   RTC 3 months  Time spent with patient was 20 minutes. Greater than 50% of face to face time with patient was spent on counseling and coordination of  care.  Patient advised to contact office with any questions, adverse effects, or acute worsening in signs and symptoms.  Discussed potential benefits, risks, and side effects of stimulants with patient to include increased heart rate, palpitations, insomnia, increased anxiety, increased irritability, or decreased appetite. Instructed patient to contact office if experiencing any significant tolerability issues.    Diagnoses and all orders for this visit:  Attention deficit hyperactivity disorder (ADHD), unspecified ADHD type -     amphetamine-dextroamphetamine (ADDERALL XR) 20 MG 24 hr capsule; Take 1 capsule (20 mg total) by mouth 2 (two) times daily. -     amphetamine-dextroamphetamine (ADDERALL XR) 20 MG 24 hr capsule; Take 1 capsule (20 mg total) by mouth 2 (two) times daily. -     amphetamine-dextroamphetamine (ADDERALL XR) 20 MG 24 hr capsule; Take 1 capsule (20 mg total) by mouth 2 (two) times daily.  Generalized anxiety disorder -     escitalopram (LEXAPRO) 20 MG tablet; Take 1 tablet (20 mg total) by mouth daily.     Please see After Visit Summary for patient specific instructions.  No future appointments.  No orders of the defined types were placed in this encounter.   -------------------------------

## 2021-09-19 ENCOUNTER — Ambulatory Visit (INDEPENDENT_AMBULATORY_CARE_PROVIDER_SITE_OTHER): Payer: Self-pay | Admitting: Adult Health

## 2021-09-19 DIAGNOSIS — F489 Nonpsychotic mental disorder, unspecified: Secondary | ICD-10-CM

## 2021-09-19 NOTE — Progress Notes (Signed)
Patient no show appointment. ? ?

## 2021-10-10 ENCOUNTER — Ambulatory Visit (INDEPENDENT_AMBULATORY_CARE_PROVIDER_SITE_OTHER): Payer: Self-pay | Admitting: Adult Health

## 2021-10-10 DIAGNOSIS — F489 Nonpsychotic mental disorder, unspecified: Secondary | ICD-10-CM

## 2021-10-10 NOTE — Progress Notes (Signed)
Patient no show appointment. ? ?

## 2021-10-11 ENCOUNTER — Other Ambulatory Visit: Payer: Self-pay | Admitting: Adult Health

## 2021-10-11 DIAGNOSIS — F411 Generalized anxiety disorder: Secondary | ICD-10-CM

## 2021-10-17 ENCOUNTER — Encounter: Payer: Self-pay | Admitting: Adult Health

## 2021-10-17 ENCOUNTER — Ambulatory Visit (INDEPENDENT_AMBULATORY_CARE_PROVIDER_SITE_OTHER): Payer: 59 | Admitting: Adult Health

## 2021-10-17 DIAGNOSIS — F411 Generalized anxiety disorder: Secondary | ICD-10-CM

## 2021-10-17 DIAGNOSIS — F909 Attention-deficit hyperactivity disorder, unspecified type: Secondary | ICD-10-CM | POA: Diagnosis not present

## 2021-10-17 MED ORDER — AMPHETAMINE-DEXTROAMPHET ER 20 MG PO CP24: 20.0000 mg | ORAL_CAPSULE | Freq: Two times a day (BID) | ORAL | 0 refills | Status: DC

## 2021-10-17 MED ORDER — ESCITALOPRAM OXALATE 20 MG PO TABS: 20.0000 mg | ORAL_TABLET | Freq: Every day | ORAL | 5 refills | Status: DC

## 2021-10-17 NOTE — Progress Notes (Signed)
Rhonda Murray [id number] 1975/03/15 46 y.o.  Subjective:   Patient ID:  Rhonda Murray is a 46 y.o. (DOB 1975/08/03) female.  Chief Complaint: No chief complaint on file.   HPI [name] presents to the office today for follow-up of ADHD and GAD.  Describes mood today as "ok". Pleasant. Mood symptoms - reports some depression. Feels anxious and irritable at times.  Has been picking at skin more - hand. Reports some situational stressors. Reports some worry and rumination - financial. Mood is consistent. Feels like medications are helpful. Stable interest and motivation. Taking medications as prescribed. Energy levels vary. Active, does not have a regular exercise routine.  Enjoys some usual interests and activities. Single, she and significant other have ended relationship, but are still living together. Has a son age 28 and a daughter age 80. Spending time with family. Appetite adequate. Weight gain. Sleeps well most nights. Averages 5 to 6 hours. Focus and concentration improved with Adderall. Completing tasks. Managing aspects of household. Works in Hotel manager support - working overtime. Denies SI or HI.  Denies AH or VH. Denies self harm. Denies substance use.  Previous medication trials:  Wellbutrin, Zoloft   PHQ2-9    Flowsheet Row Office Visit from 08/12/2020 in [place] Visit from 08/12/2019 in [place] Visit from 04/28/2019 in [place] from 03/18/2018 in [place] Visit from 12/17/2017 in [facility]  PHQ-2 Total Score 0 0 0 2 0  PHQ-9 Total Score 0 1 0 7 5        Review of Systems:  Review of Systems  Musculoskeletal:  Negative for gait problem.  Neurological:  Negative for tremors.  Psychiatric/Behavioral:         Please refer to HPI    Medications: I have reviewed the patient's current medications.  Current  Outpatient Medications  Medication Sig Dispense Refill   albuterol (VENTOLIN HFA) 108 (90 Base) MCG/ACT inhaler Inhale 2 puffs into the lungs every 6 (six) hours as needed for wheezing or shortness of breath. 8 g 2   amphetamine-dextroamphetamine (ADDERALL XR) 20 MG 24 hr capsule Take 1 capsule (20 mg total) by mouth 2 (two) times daily. 60 capsule 0   amphetamine-dextroamphetamine (ADDERALL XR) 20 MG 24 hr capsule Take 1 capsule (20 mg total) by mouth 2 (two) times daily. 60 capsule 0   amphetamine-dextroamphetamine (ADDERALL XR) 20 MG 24 hr capsule Take 1 capsule (20 mg total) by mouth 2 (two) times daily. 60 capsule 0   escitalopram (LEXAPRO) 20 MG tablet Take 1 tablet by mouth once daily 30 tablet 0   Insulin Pen Needle 32G X 6 MM MISC 1 each by Does not apply route daily. 100 each 1   metFORMIN (GLUCOPHAGE) 500 MG tablet TAKE 1 TABLET (500 MG TOTAL) BY MOUTH 2 (TWO) TIMES DAILY WITH A MEAL. 180 tablet 1   Semaglutide,0.25 or 0.'5MG'$ /DOS, (OZEMPIC, 0.25 OR 0.5 MG/DOSE,) 2 MG/1.5ML SOPN Inject 0.5 mg into the skin once a week. 4.5 mL 1   Vitamin D, Ergocalciferol, (DRISDOL) 1.25 MG (50000 UNIT) CAPS capsule Take 1 capsule (50,000 Units total) by mouth every 7 (seven) days. 12 capsule 1   No current facility-administered medications for this visit.    Medication Side Effects: None  Allergies: No Known Allergies  Past Medical History:  Diagnosis Date   Anxiety unknown   COVID-19 virus infection 09/2019   Morbid obesity with BMI of 45.0-49.9,  adult [facility])    Sleep apnea     Past Medical History, Surgical history, Social history, and Family history were reviewed and updated as appropriate.   Please see review of systems for further details on the patient's review from today.   Objective:   Physical Exam:  There were no vitals taken for this visit.  Physical Exam Constitutional:      General: She is not in acute distress. Musculoskeletal:        General: No deformity.   Neurological:     Mental Status: She is alert and oriented to person, place, and time.     Coordination: Coordination normal.  Psychiatric:        Attention and Perception: Attention and perception normal. She does not perceive auditory or visual hallucinations.        Mood and Affect: Mood normal. Mood is not anxious or depressed. Affect is not labile, blunt, angry or inappropriate.        Speech: Speech normal.        Behavior: Behavior normal.        Thought Content: Thought content normal. Thought content is not paranoid or delusional. Thought content does not include homicidal or suicidal ideation. Thought content does not include homicidal or suicidal plan.        Cognition and Memory: Cognition and memory normal.        Judgment: Judgment normal.     Comments: Insight intact     Lab Review:     Component Value Date/Time   NA 141 08/12/2020 1607   K 3.9 08/12/2020 1607   CL 102 08/12/2020 1607   CO2 25 08/12/2020 1607   GLUCOSE 90 08/12/2020 1607   GLUCOSE 95 09/18/2019 2131   BUN 8 08/12/2020 1607   CREATININE 1.02 (H) 08/12/2020 1607   CALCIUM 9.3 08/12/2020 1607   PROT 7.4 06/09/2020 1616   ALBUMIN 3.9 06/09/2020 1616   AST 23 06/09/2020 1616   ALT 31 06/09/2020 1616   ALKPHOS 111 06/09/2020 1616   BILITOT 0.4 06/09/2020 1616   GFRNONAA >60 09/18/2019 2131   GFRAA >60 09/18/2019 2131       Component Value Date/Time   WBC 11.3 (H) 06/09/2020 1616   WBC 7.2 09/18/2019 2131   RBC 4.00 06/09/2020 1616   RBC 4.73 09/18/2019 2131   HGB 12.6 06/09/2020 1616   HCT 37.3 06/09/2020 1616   PLT 327 06/09/2020 1616   MCV 93 06/09/2020 1616   MCH 31.5 06/09/2020 1616   MCH 30.7 09/18/2019 2131   MCHC 33.8 06/09/2020 1616   MCHC 33.2 09/18/2019 2131   RDW 12.1 06/09/2020 1616   LYMPHSABS 2.6 09/18/2019 2131   MONOABS 0.5 09/18/2019 2131   EOSABS 0.1 09/18/2019 2131   BASOSABS 0.0 09/18/2019 2131    No results found for: "POCLITH", "LITHIUM"   No results found  for: "PHENYTOIN", "PHENOBARB", "VALPROATE", "CBMZ"   .res Assessment: Plan:    Plan:  PDMP reviewed  Lexapro '20mg'$  daily Adderall XR '20mg'$  BID   [facility] ADHD screening - 47/58 ADHD   RTC 3 months  Time spent with patient was 20 minutes. Greater than 50% of face to face time with patient was spent on counseling and coordination of care.  Patient advised to contact office with any questions, adverse effects, or acute worsening in signs and symptoms.  Discussed potential benefits, risks, and side effects of stimulants with patient to include increased heart rate, palpitations, insomnia, increased anxiety, increased irritability, or  decreased appetite. Instructed patient to contact office if experiencing any significant tolerability issues.   Diagnoses and all orders for this visit:  Attention deficit hyperactivity disorder (ADHD), unspecified ADHD type  Generalized anxiety disorder     Please see After Visit Summary for patient specific instructions.  Future Appointments  Date Time Provider [place]  10/24/2021 12:20 PM [name], FNP [facility] None    No orders of the defined types were placed in this encounter.   -------------------------------

## 2021-10-24 ENCOUNTER — Encounter: Payer: Self-pay | Admitting: Nurse Practitioner

## 2021-10-24 ENCOUNTER — Ambulatory Visit (INDEPENDENT_AMBULATORY_CARE_PROVIDER_SITE_OTHER): Payer: 59 | Admitting: Nurse Practitioner

## 2021-10-24 VITALS — BP 130/70 | HR 100 | Temp 98.2°F | Ht 67.0 in | Wt 311.6 lb

## 2021-10-24 DIAGNOSIS — E88819 Insulin resistance, unspecified: Secondary | ICD-10-CM

## 2021-10-24 DIAGNOSIS — R7309 Other abnormal glucose: Secondary | ICD-10-CM

## 2021-10-24 DIAGNOSIS — G44209 Tension-type headache, unspecified, not intractable: Secondary | ICD-10-CM

## 2021-10-24 DIAGNOSIS — E559 Vitamin D deficiency, unspecified: Secondary | ICD-10-CM

## 2021-10-24 DIAGNOSIS — Z1231 Encounter for screening mammogram for malignant neoplasm of breast: Secondary | ICD-10-CM

## 2021-10-24 DIAGNOSIS — F32A Depression, unspecified: Secondary | ICD-10-CM

## 2021-10-24 DIAGNOSIS — F439 Reaction to severe stress, unspecified: Secondary | ICD-10-CM | POA: Diagnosis not present

## 2021-10-24 MED ORDER — CYCLOBENZAPRINE HCL 10 MG PO TABS: 10.0000 mg | ORAL_TABLET | Freq: Three times a day (TID) | ORAL | 0 refills | Status: DC | PRN

## 2021-10-24 NOTE — Progress Notes (Signed)
I,Tianna Badgett,acting as a Education administrator for [employer], FNP.,have documented all relevant documentation on the behalf of [name], FNP,as directed by  [name], FNP while in the presence of [name], [place].  Subjective:     Patient ID: Rhonda Murray , female    DOB: [birth year] , 46 y.o.   MRN: [id number]   Chief Complaint  Patient presents with   Headache    HPI  Patient presents today for head pain to the left lower part of her head which feels like a "crook" in her neck. Feels like a "quick sharp pain". Noticeable but able to "shake it off". She will use a heating pad and take a tylenol. Last less than 5 minutes. Some days are more often than others. Has been occurring for a few months.   She is no longer taking the metformin and stopped taking ozempic did not feel it was effective to curb her appetite. She is working swing shift due to children's schedule. She has been under more stress with her boyfriend. She is looking for a new job. She is under more stress with her family life.   Wt Readings from Last 3 Encounters: 10/24/21 : (!) 311 lb 9.6 oz (141.3 kg) 08/12/20 : 282 lb (127.9 kg) 06/09/20 : 270 lb 12.8 oz (122.8 kg)     Past Medical History:  Diagnosis Date   Anxiety unknown   COVID-19 virus infection 09/2019   Morbid obesity with BMI of 45.0-49.9, adult (HCC)    Sleep apnea      Family History  Problem Relation Age of Onset   Stroke Mother    Hypertension Mother      Current Outpatient Medications:    albuterol (VENTOLIN HFA) 108 (90 Base) MCG/ACT inhaler, Inhale 2 puffs into the lungs every 6 (six) hours as needed for wheezing or shortness of breath., Disp: 8 g, Rfl: 2   amphetamine-dextroamphetamine (ADDERALL XR) 20 MG 24 hr capsule, Take 1 capsule (20 mg total) by mouth 2 (two) times daily., Disp: 60 capsule, Rfl: 0   cyclobenzaprine (FLEXERIL) 10 MG tablet, Take 1 tablet (10 mg total) by mouth 3 (three) times daily as needed for muscle spasms., Disp:  30 tablet, Rfl: 0   escitalopram (LEXAPRO) 20 MG tablet, Take 1 tablet (20 mg total) by mouth daily., Disp: 30 tablet, Rfl: 5   [START ON 11/14/2021] amphetamine-dextroamphetamine (ADDERALL XR) 20 MG 24 hr capsule, Take 1 capsule (20 mg total) by mouth 2 (two) times daily. (Patient not taking: Reported on 10/24/2021), Disp: 60 capsule, Rfl: 0   [START ON 12/12/2021] amphetamine-dextroamphetamine (ADDERALL XR) 20 MG 24 hr capsule, Take 1 capsule (20 mg total) by mouth 2 (two) times daily. (Patient not taking: Reported on 10/24/2021), Disp: 60 capsule, Rfl: 0   Insulin Pen Needle 32G X 6 MM MISC, 1 each by Does not apply route daily. (Patient not taking: Reported on 10/24/2021), Disp: 100 each, Rfl: 1   metFORMIN (GLUCOPHAGE) 500 MG tablet, TAKE 1 TABLET (500 MG TOTAL) BY MOUTH 2 (TWO) TIMES DAILY WITH A MEAL., Disp: 180 tablet, Rfl: 1   Vitamin D, Ergocalciferol, (DRISDOL) 1.25 MG (50000 UNIT) CAPS capsule, Take 1 capsule (50,000 Units total) by mouth every 7 (seven) days. (Patient not taking: Reported on 10/24/2021), Disp: 12 capsule, Rfl: 1   No Known Allergies   Review of Systems  Constitutional: Negative.   Respiratory: Negative.    Cardiovascular: Negative.   Gastrointestinal: Negative.  Negative for nausea and vomiting.  Neurological:  Positive for light-headedness (when getting up too fast) and headaches.  Psychiatric/Behavioral: Negative.       Today's Vitals   10/24/21 1238  BP: 130/70  Pulse: 100  Temp: 98.2 F (36.8 C)  TempSrc: Oral  Weight: (!) 311 lb 9.6 oz (141.3 kg)  Height: _0  (1.702 m)   Body mass index is 48.8 kg/m.   Objective:  Physical Exam Vitals reviewed.  Constitutional:      General: She is not in acute distress.    Appearance: She is well-developed. She is obese.  Pulmonary:     Effort: Pulmonary effort is normal. No respiratory distress.     Breath sounds: Normal breath sounds. No wheezing.  Skin:    General: Skin is warm and dry.     Capillary  Refill: Capillary refill takes less than 2 seconds.  Neurological:     Mental Status: She is alert and oriented to person, place, and time.  Psychiatric:        Mood and Affect: Mood normal. Mood is not anxious.        Speech: Speech normal.        Behavior: Behavior normal.         Assessment And Plan:     1. Insulin resistance Comments: Continue metformin, she had stopped taking briefly  2. Vitamin D deficiency - VITAMIN D 25 Hydroxy (Vit-D Deficiency, Fractures)  3. Abnormal glucose - Hemoglobin A1c  4. Stress Comments: Discussed with her that this can cause her blood sugars to increase and increase in her weight  - Ambulatory referral to Psychology - CMP14+EGFR  5. Acute non intractable tension-type headache Comments: May be related to increased stress, will treat with cyclobenzaprine. Encouraged to use heating pad on low - cyclobenzaprine (FLEXERIL) 10 MG tablet; Take 1 tablet (10 mg total) by mouth 3 (three) times daily as needed for muscle spasms.  Dispense: 30 tablet; Refill: 0  6. Depression, unspecified depression type Comments: Depression screen is 7, this is increased since her last visit. Will refer for counseling - CMP14+EGFR  7. Encounter for screening mammogram for breast cancer Pt instructed on Self Breast Exam.According to ACOG guidelines Women aged 59 and older are recommended to get an annual mammogram. Form completed and given to patient contact the [facility] for appointment scheduing.  Pt encouraged to get annual mammogram - MM 3D SCREEN BREAST BILATERAL; Future    Patient was given opportunity to ask questions. Patient verbalized understanding of the plan and was able to repeat key elements of the plan. All questions were answered to their satisfaction.  [name], FNP   I, [name], FNP, have reviewed all documentation for this visit. The documentation on 10/24/21 for the exam, diagnosis, procedures, and orders are all accurate and  complete.   IF YOU HAVE BEEN REFERRED TO A SPECIALIST, IT MAY TAKE 1-2 WEEKS TO SCHEDULE/PROCESS THE REFERRAL. IF YOU HAVE NOT HEARD FROM US/SPECIALIST IN TWO WEEKS, PLEASE GIVE [place] A CALL AT [phone].   THE PATIENT IS ENCOURAGED TO PRACTICE SOCIAL DISTANCING DUE TO THE COVID-19 PANDEMIC.

## 2021-10-24 NOTE — Patient Instructions (Signed)
General Headache Without Cause A headache is pain or discomfort felt around the head or neck area. There are many causes and types of headaches. A few common types include: Tension headaches. Migraine headaches. Cluster headaches. Chronic daily headaches. Sometimes, the specific cause of a headache may not be found. Follow these instructions at home: Watch your condition for any changes. Let your health care provider know about them. Take these steps to help with your condition: Managing pain     Take over-the-counter and prescription medicines only as told by your health care provider. Treatment may include medicines for pain that are taken by mouth or applied to the skin. Lie down in a dark, quiet room when you have a headache. Keep lights dim if bright lights bother you or make your headaches worse. If directed, put ice on your head and neck area: Put ice in a plastic bag. Place a towel between your skin and the bag. Leave the ice on for 20 minutes, 2-3 times per day. Remove the ice if your skin turns bright red. This is very important. If you cannot feel pain, heat, or cold, you have a greater risk of damage to the area. If directed, apply heat to the affected area. Use the heat source that your health care provider recommends, such as a moist heat pack or a heating pad. Place a towel between your skin and the heat source. Leave the heat on for 20-30 minutes. Remove the heat if your skin turns bright red. This is especially important if you are unable to feel pain, heat, or cold. You have a greater risk of getting burned. Eating and drinking Eat meals on a regular schedule. If you drink alcohol: Limit how much you have to: 0-1 drink a day for women who are not pregnant. 0-2 drinks a day for men. Know how much alcohol is in a drink. In the [place] one drink equals one 12 oz bottle of beer (355 mL), one 5 oz glass of wine (148 mL), or one 1 oz glass of hard liquor (44 mL). Stop  drinking caffeine, or decrease the amount of caffeine you drink. Drink enough fluid to keep your urine pale yellow. General instructions  Keep a headache journal to help find out what may trigger your headaches. For example, write down: What you eat and drink. How much sleep you get. Any change to your diet or medicines. Try massage or other relaxation techniques. Limit stress. Sit up straight, and do not tense your muscles. Do not use any products that contain nicotine or tobacco. These products include cigarettes, chewing tobacco, and vaping devices, such as e-cigarettes. If you need help quitting, ask your health care provider. Exercise regularly as told by your health care provider. Sleep on a regular schedule. Get 7-9 hours of sleep each night, or the amount recommended by your health care provider. Keep all follow-up visits. This is important. Contact a health care provider if: Medicine does not help your symptoms. You have a headache that is different from your usual headache. You have nausea or you vomit. You have a fever. Get help right away if: Your headache: Becomes severe quickly. Gets worse after moderate to intense physical activity. You have any of these symptoms: Repeated vomiting. Pain or stiffness in your neck. Changes to your vision. Pain in an eye or ear. Problems with speech. Muscular weakness or loss of muscle control. Loss of balance or coordination. You feel faint or pass out. You have confusion. You have   a seizure. These symptoms may represent a serious problem that is an emergency. Do not wait to see if the symptoms will go away. Get medical help right away. Call your local emergency services (911 in the [place] Do not drive yourself to the hospital. Summary A headache is pain or discomfort felt around the head or neck area. There are many causes and types of headaches. In some cases, the cause may not be found. Keep a headache journal to help find out  what may trigger your headaches. Watch your condition for any changes. Let your health care provider know about them. Contact a health care provider if you have a headache that is different from the usual headache, or if your symptoms are not helped by medicine. Get help right away if your headache becomes severe, you vomit, you have a loss of vision, you lose your balance, or you have a seizure. This information is not intended to replace advice given to you by your health care provider. Make sure you discuss any questions you have with your health care provider. Document Revised: 05/26/2020 Document Reviewed: 05/26/2020 Elsevier Patient Education  2023 Elsevier Inc.  

## 2021-10-25 LAB — CMP14+EGFR
ALT: 22 IU/L (ref 0–32)
AST: 23 IU/L (ref 0–40)
Albumin/Globulin Ratio: 1.4 (ref 1.2–2.2)
Albumin: 4.2 g/dL (ref 3.9–4.9)
Alkaline Phosphatase: 120 IU/L (ref 44–121)
BUN/Creatinine Ratio: 8 — ABNORMAL LOW (ref 9–23)
BUN: 7 mg/dL (ref 6–24)
Bilirubin Total: 0.3 mg/dL (ref 0.0–1.2)
CO2: 23 mmol/L (ref 20–29)
Calcium: 8.5 mg/dL — ABNORMAL LOW (ref 8.7–10.2)
Chloride: 100 mmol/L (ref 96–106)
Creatinine, Ser: 0.89 mg/dL (ref 0.57–1.00)
Globulin, Total: 3.1 g/dL (ref 1.5–4.5)
Glucose: 125 mg/dL — ABNORMAL HIGH (ref 70–99)
Potassium: 3.5 mmol/L (ref 3.5–5.2)
Sodium: 137 mmol/L (ref 134–144)
Total Protein: 7.3 g/dL (ref 6.0–8.5)
eGFR: 82 mL/min/{1.73_m2} (ref >59)

## 2021-10-25 LAB — HEMOGLOBIN A1C
Est. average glucose Bld gHb Est-mCnc: 117 mg/dL
Hgb A1c MFr Bld: 5.7 % — ABNORMAL HIGH (ref 4.8–5.6)

## 2021-10-25 LAB — VITAMIN D 25 HYDROXY (VIT D DEFICIENCY, FRACTURES): Vit D, 25-Hydroxy: 46.1 ng/mL (ref 30.0–100.0)

## 2021-11-06 ENCOUNTER — Other Ambulatory Visit: Payer: Self-pay | Admitting: Nurse Practitioner

## 2021-11-06 DIAGNOSIS — R7309 Other abnormal glucose: Secondary | ICD-10-CM

## 2021-11-07 MED ORDER — METFORMIN HCL 500 MG PO TABS: 500.0000 mg | ORAL_TABLET | Freq: Two times a day (BID) | ORAL | 1 refills | Status: DC

## 2021-12-22 ENCOUNTER — Other Ambulatory Visit: Payer: Self-pay | Admitting: Nurse Practitioner

## 2021-12-22 ENCOUNTER — Ambulatory Visit
Admission: RE | Admit: 2021-12-22 | Discharge: 2021-12-22 | Disposition: A | Discharge status: Home / Self Care | Payer: 59 | Source: Ambulatory Visit | Attending: Nurse Practitioner | Admitting: Nurse Practitioner

## 2021-12-22 DIAGNOSIS — Z1231 Encounter for screening mammogram for malignant neoplasm of breast: Secondary | ICD-10-CM

## 2022-01-11 ENCOUNTER — Other Ambulatory Visit: Payer: Self-pay

## 2022-01-11 ENCOUNTER — Telehealth: Payer: Self-pay | Admitting: Adult Health

## 2022-01-11 NOTE — Telephone Encounter (Signed)
Pt has refill at pharmacy

## 2022-01-11 NOTE — Telephone Encounter (Signed)
Pt LVM @ 1:21p.  She would like refill of Adderall sent to [facility].  It is available there.  Next appt 1/8

## 2022-01-12 ENCOUNTER — Telehealth: Payer: 59 | Admitting: Emergency Medicine

## 2022-01-12 DIAGNOSIS — R051 Acute cough: Secondary | ICD-10-CM

## 2022-01-12 DIAGNOSIS — J3489 Other specified disorders of nose and nasal sinuses: Secondary | ICD-10-CM | POA: Diagnosis not present

## 2022-01-12 MED ORDER — AMOXICILLIN-POT CLAVULANATE 875-125 MG PO TABS: 1.0000 | ORAL_TABLET | Freq: Two times a day (BID) | ORAL | 0 refills | Status: DC

## 2022-01-12 MED ORDER — BENZONATATE 100 MG PO CAPS: 100.0000 mg | ORAL_CAPSULE | Freq: Two times a day (BID) | ORAL | 0 refills | Status: DC | PRN

## 2022-01-12 NOTE — Patient Instructions (Signed)
Rhonda Murray, thank you for joining Montine Circle, PA-C for today's virtual visit.  While this provider is not your primary care provider (PCP), if your PCP is located in our provider database this encounter information will be shared with them immediately following your visit.   [facility] account gives you access to today's visit and all your visits, tests, and labs performed at [facility] " click here if you don't have a [facility] account or go to [email or web address]  Consent: (Patient) Rhonda Murray provided verbal consent for this virtual visit at the beginning of the encounter.  Current Medications:  Current Outpatient Medications:    amoxicillin-clavulanate (AUGMENTIN) 875-125 MG tablet, Take 1 tablet by mouth every 12 (twelve) hours., Disp: 14 tablet, Rfl: 0   benzonatate (TESSALON) 100 MG capsule, Take 1 capsule (100 mg total) by mouth 2 (two) times daily as needed for cough., Disp: 20 capsule, Rfl: 0   albuterol (VENTOLIN HFA) 108 (90 Base) MCG/ACT inhaler, Inhale 2 puffs into the lungs every 6 (six) hours as needed for wheezing or shortness of breath., Disp: 8 g, Rfl: 2   amphetamine-dextroamphetamine (ADDERALL XR) 20 MG 24 hr capsule, Take 1 capsule (20 mg total) by mouth 2 (two) times daily., Disp: 60 capsule, Rfl: 0   amphetamine-dextroamphetamine (ADDERALL XR) 20 MG 24 hr capsule, Take 1 capsule (20 mg total) by mouth 2 (two) times daily. (Patient not taking: Reported on 10/24/2021), Disp: 60 capsule, Rfl: 0   amphetamine-dextroamphetamine (ADDERALL XR) 20 MG 24 hr capsule, Take 1 capsule (20 mg total) by mouth 2 (two) times daily. (Patient not taking: Reported on 10/24/2021), Disp: 60 capsule, Rfl: 0   cyclobenzaprine (FLEXERIL) 10 MG tablet, Take 1 tablet (10 mg total) by mouth 3 (three) times daily as needed for muscle spasms., Disp: 30 tablet, Rfl: 0   escitalopram (LEXAPRO) 20 MG tablet, Take 1 tablet (20 mg total) by mouth daily.,  Disp: 30 tablet, Rfl: 5   Insulin Pen Needle 32G X 6 MM MISC, 1 each by Does not apply route daily. (Patient not taking: Reported on 10/24/2021), Disp: 100 each, Rfl: 1   metFORMIN (GLUCOPHAGE) 500 MG tablet, Take 1 tablet (500 mg total) by mouth 2 (two) times daily with a meal., Disp: 180 tablet, Rfl: 1   Vitamin D, Ergocalciferol, (DRISDOL) 1.25 MG (50000 UNIT) CAPS capsule, Take 1 capsule (50,000 Units total) by mouth every 7 (seven) days. (Patient not taking: Reported on 10/24/2021), Disp: 12 capsule, Rfl: 1   Medications ordered in this encounter:  Meds ordered this encounter  Medications   amoxicillin-clavulanate (AUGMENTIN) 875-125 MG tablet    Sig: Take 1 tablet by mouth every 12 (twelve) hours.    Dispense:  14 tablet    Refill:  0    Order Specific Question:   Supervising Provider    Answer:   Chase Picket [[id number]]   benzonatate (TESSALON) 100 MG capsule    Sig: Take 1 capsule (100 mg total) by mouth 2 (two) times daily as needed for cough.    Dispense:  20 capsule    Refill:  0    Order Specific Question:   Supervising Provider    Answer:   Chase Picket [id number]     *If you need refills on other medications prior to your next appointment, please contact your pharmacy*  Follow-Up: Call back or seek an in-person evaluation if the symptoms worsen or if the condition fails to improve as anticipated.  [facility] [phone]  Other Instructions    If you have been instructed to have an in-person evaluation today at a local Urgent Care facility, please use the link below. It will take you to a list of all of our available [facility], including address, phone number and hours of operation. Please do not delay care.  [facility]  If you or a family member do not have a primary care provider, use the link below to schedule a visit and establish care. When you choose a [facility] primary care physician or advanced practice  provider, you gain a long-term partner in health. Find a Primary Care Provider  Learn more about [facility]'s in-office and virtual care options: [facility]

## 2022-01-12 NOTE — Progress Notes (Signed)
Virtual Visit Consent   Rhonda Murray, you are scheduled for a virtual visit with a Center provider today. Just as with appointments in the office, your consent must be obtained to participate. Your consent will be active for this visit and any virtual visit you may have with one of our providers in the next 365 days. If you have a MyChart account, a copy of this consent can be sent to you electronically.  As this is a virtual visit, video technology does not allow for your provider to perform a traditional examination. This may limit your provider's ability to fully assess your condition. If your provider identifies any concerns that need to be evaluated in person or the need to arrange testing (such as labs, EKG, etc.), we will make arrangements to do so. Although advances in technology are sophisticated, we cannot ensure that it will always work on either your end or our end. If the connection with a video visit is poor, the visit may have to be switched to a telephone visit. With either a video or telephone visit, we are not always able to ensure that we have a secure connection.  By engaging in this virtual visit, you consent to the provision of healthcare and authorize for your insurance to be billed (if applicable) for the services provided during this visit. Depending on your insurance coverage, you may receive a charge related to this service.  I need to obtain your verbal consent now. Are you willing to proceed with your visit today? Rhonda Murray has provided verbal consent on 01/12/2022 for a virtual visit (video or telephone). Montine Circle, PA-C  Date: 01/12/2022 9:13 AM  Virtual Visit via Video Note   I, Montine Circle, connected with  Mazella Deen  ([id number], 1975/08/25) on 01/12/22 at  9:15 AM EST by a video-enabled telemedicine application and verified that I am speaking with the correct person using two identifiers.  Location: Patient: Virtual Visit Location Patient:  Home Provider: Virtual Visit Location Provider: Home Office   I discussed the limitations of evaluation and management by telemedicine and the availability of in person appointments. The patient expressed understanding and agreed to proceed.    History of Present Illness: Rhonda Murray is a 47 y.o. who identifies as a female who was assigned female at birth, and is being seen today for cough.  States that she has had dry cough since 12/23.  States that the cough is persistent.  She has been taking flonase.  States that she has tried anything she can over-the-counter.  She states that she sometimes coughs so hard she has emesis.  Denies fever.  She does reports associated sinus pressure and congestion.  Denies any antibiotic allergies.  Denies pregnancy or breast feeding.  HPI: HPI  Problems:  Patient Active Problem List   Diagnosis Date Noted   COVID-19 virus infection 09/2019   Depression 04/07/2018   Right lower quadrant abdominal pain 03/18/2018   Nocturia 03/18/2018   Morbid obesity with BMI of 45.0-49.9, adult ([place]) 03/18/2018   Tingling of skin 03/18/2018   History of cesarean section, unknown scar 11/09/2014   Hypokalemia 12/26/2012    Allergies: No Known Allergies Medications:  Current Outpatient Medications:    albuterol (VENTOLIN HFA) 108 (90 Base) MCG/ACT inhaler, Inhale 2 puffs into the lungs every 6 (six) hours as needed for wheezing or shortness of breath., Disp: 8 g, Rfl: 2   amphetamine-dextroamphetamine (ADDERALL XR) 20 MG 24 hr capsule, Take 1 capsule (20 mg total)  by mouth 2 (two) times daily., Disp: 60 capsule, Rfl: 0   amphetamine-dextroamphetamine (ADDERALL XR) 20 MG 24 hr capsule, Take 1 capsule (20 mg total) by mouth 2 (two) times daily. (Patient not taking: Reported on 10/24/2021), Disp: 60 capsule, Rfl: 0   amphetamine-dextroamphetamine (ADDERALL XR) 20 MG 24 hr capsule, Take 1 capsule (20 mg total) by mouth 2 (two) times daily. (Patient not taking: Reported on  10/24/2021), Disp: 60 capsule, Rfl: 0   cyclobenzaprine (FLEXERIL) 10 MG tablet, Take 1 tablet (10 mg total) by mouth 3 (three) times daily as needed for muscle spasms., Disp: 30 tablet, Rfl: 0   escitalopram (LEXAPRO) 20 MG tablet, Take 1 tablet (20 mg total) by mouth daily., Disp: 30 tablet, Rfl: 5   Insulin Pen Needle 32G X 6 MM MISC, 1 each by Does not apply route daily. (Patient not taking: Reported on 10/24/2021), Disp: 100 each, Rfl: 1   metFORMIN (GLUCOPHAGE) 500 MG tablet, Take 1 tablet (500 mg total) by mouth 2 (two) times daily with a meal., Disp: 180 tablet, Rfl: 1   Vitamin D, Ergocalciferol, (DRISDOL) 1.25 MG (50000 UNIT) CAPS capsule, Take 1 capsule (50,000 Units total) by mouth every 7 (seven) days. (Patient not taking: Reported on 10/24/2021), Disp: 12 capsule, Rfl: 1  Observations/Objective: Patient is well-developed, well-nourished in no acute distress.  Resting comfortably  at home.  Head is normocephalic, atraumatic.  No labored breathing.  Speech is clear and coherent with logical content.  Patient is alert and oriented at baseline.  Dry cough during interview  Assessment and Plan: 1. Acute cough  2. Sinus pressure  Meds ordered this encounter  Medications   amoxicillin-clavulanate (AUGMENTIN) 875-125 MG tablet    Sig: Take 1 tablet by mouth every 12 (twelve) hours.    Dispense:  14 tablet    Refill:  0    Order Specific Question:   Supervising Provider    Answer:   Chase Picket [[id number]]   benzonatate (TESSALON) 100 MG capsule    Sig: Take 1 capsule (100 mg total) by mouth 2 (two) times daily as needed for cough.    Dispense:  20 capsule    Refill:  0    Order Specific Question:   Supervising Provider    Answer:   Chase Picket [id number]   Cough since 12/23 with associated sinus pressure.  Might be worth an antibiotic trial at this point.  Will treat with augmentin and tessalon.  PCP follow-up if not improving.  Follow Up Instructions: I  discussed the assessment and treatment plan with the patient. The patient was provided an opportunity to ask questions and all were answered. The patient agreed with the plan and demonstrated an understanding of the instructions.  A copy of instructions were sent to the patient via MyChart unless otherwise noted below.     The patient was advised to call back or seek an in-person evaluation if the symptoms worsen or if the condition fails to improve as anticipated.  Time:  I spent 12 minutes with the patient via telehealth technology discussing the above problems/concerns.    Montine Circle, PA-C

## 2022-01-16 ENCOUNTER — Encounter: Payer: Self-pay | Admitting: Adult Health

## 2022-01-16 ENCOUNTER — Ambulatory Visit (INDEPENDENT_AMBULATORY_CARE_PROVIDER_SITE_OTHER): Payer: 59 | Admitting: Adult Health

## 2022-01-16 DIAGNOSIS — F909 Attention-deficit hyperactivity disorder, unspecified type: Secondary | ICD-10-CM | POA: Diagnosis not present

## 2022-01-16 DIAGNOSIS — F411 Generalized anxiety disorder: Secondary | ICD-10-CM | POA: Diagnosis not present

## 2022-01-16 MED ORDER — AMPHETAMINE-DEXTROAMPHET ER 20 MG PO CP24: 20.0000 mg | ORAL_CAPSULE | Freq: Two times a day (BID) | ORAL | 0 refills | Status: DC

## 2022-01-16 MED ORDER — ESCITALOPRAM OXALATE 20 MG PO TABS: 20.0000 mg | ORAL_TABLET | Freq: Every day | ORAL | 5 refills | Status: DC

## 2022-01-16 NOTE — Progress Notes (Signed)
Rhonda Murray [id number] 08-01-1975 47 y.o.  Subjective:   Patient ID:  Rhonda Murray is a 47 y.o. (DOB [birth year]) female.  Chief Complaint: No chief complaint on file.   HPI Rhonda Murray presents to the office today for follow-up of ADHD and GAD.  Describes mood today as "ok". Pleasant. Mood symptoms - reports some depression - "a little". Stating "it's nothing I can't deal with". Feels anxious and irritable at times. Reports skin picking "it's better". Reports some situational stressors - recently lost her job. Reports some worry and rumination - financial. Mood is consistent. Feels like medications are helpful. Stable interest and motivation. Taking medications as prescribed. Energy levels vary. Active, does not have a regular exercise routine.  Enjoys some usual interests and activities. Single, she and significant other have ended relationship, but are still living together. Has a son age 46 and a daughter age 36. Spending time with family. Appetite adequate. Weight "up and down". Sleeps well most nights. Averages 7 to 8 hours. Focus and concentration improved with Adderall. Completing tasks. Managing aspects of household. Currently laid off and looking for employment. Denies SI or HI.  Denies AH or VH. Denies self harm. Denies substance use.  Has started therapy.  Previous medication trials:  Wellbutrin, Zoloft   PHQ2-9    Flowsheet Row Office Visit from 10/24/2021 in [facility] Office Visit from 08/12/2020 in [facility] Visit from 08/12/2019 in [facility] from 04/28/2019 in [facility] Visit from 03/18/2018 in [facility]  PHQ-2 Total Score 1 0 0 0 2  PHQ-9 Total Score 7 0 1 0 7        Review of Systems:  Review of Systems  Musculoskeletal:  Negative for gait problem.  Neurological:  Negative for tremors.  Psychiatric/Behavioral:          Please refer to HPI    Medications: I have reviewed the patient's current medications.  Current Outpatient Medications  Medication Sig Dispense Refill   albuterol (VENTOLIN HFA) 108 (90 Base) MCG/ACT inhaler Inhale 2 puffs into the lungs every 6 (six) hours as needed for wheezing or shortness of breath. 8 g 2   amoxicillin-clavulanate (AUGMENTIN) 875-125 MG tablet Take 1 tablet by mouth every 12 (twelve) hours. 14 tablet 0   amphetamine-dextroamphetamine (ADDERALL XR) 20 MG 24 hr capsule Take 1 capsule (20 mg total) by mouth 2 (two) times daily. 60 capsule 0   [START ON 02/13/2022] amphetamine-dextroamphetamine (ADDERALL XR) 20 MG 24 hr capsule Take 1 capsule (20 mg total) by mouth 2 (two) times daily. 60 capsule 0   [START ON 03/13/2022] amphetamine-dextroamphetamine (ADDERALL XR) 20 MG 24 hr capsule Take 1 capsule (20 mg total) by mouth 2 (two) times daily. 60 capsule 0   benzonatate (TESSALON) 100 MG capsule Take 1 capsule (100 mg total) by mouth 2 (two) times daily as needed for cough. 20 capsule 0   cyclobenzaprine (FLEXERIL) 10 MG tablet Take 1 tablet (10 mg total) by mouth 3 (three) times daily as needed for muscle spasms. 30 tablet 0   escitalopram (LEXAPRO) 20 MG tablet Take 1 tablet (20 mg total) by mouth daily. 30 tablet 5   Insulin Pen Needle 32G X 6 MM MISC 1 each by Does not apply route daily. (Patient not taking: Reported on 10/24/2021) 100 each 1   metFORMIN (GLUCOPHAGE) 500 MG tablet Take 1 tablet (500 mg total) by mouth 2 (two) times daily with  a meal. 180 tablet 1   Vitamin D, Ergocalciferol, (DRISDOL) 1.25 MG (50000 UNIT) CAPS capsule Take 1 capsule (50,000 Units total) by mouth every 7 (seven) days. (Patient not taking: Reported on 10/24/2021) 12 capsule 1   No current facility-administered medications for this visit.    Medication Side Effects: None  Allergies: No Known Allergies  Past Medical History:  Diagnosis Date   Anxiety unknown   COVID-19 virus infection  09/2019   Morbid obesity with BMI of 45.0-49.9, adult [facility])    Sleep apnea     Past Medical History, Surgical history, Social history, and Family history were reviewed and updated as appropriate.   Please see review of systems for further details on the patient's review from today.   Objective:   Physical Exam:  There were no vitals taken for this visit.  Physical Exam Constitutional:      General: She is not in acute distress. Musculoskeletal:        General: No deformity.  Neurological:     Mental Status: She is alert and oriented to person, place, and time.     Coordination: Coordination normal.  Psychiatric:        Attention and Perception: Attention and perception normal. She does not perceive auditory or visual hallucinations.        Mood and Affect: Mood normal. Mood is not anxious or depressed. Affect is not labile, blunt, angry or inappropriate.        Speech: Speech normal.        Behavior: Behavior normal.        Thought Content: Thought content normal. Thought content is not paranoid or delusional. Thought content does not include homicidal or suicidal ideation. Thought content does not include homicidal or suicidal plan.        Cognition and Memory: Cognition and memory normal.        Judgment: Judgment normal.     Comments: Insight intact     Lab Review:     Component Value Date/Time   NA 137 10/24/2021 1316   K 3.5 10/24/2021 1316   CL 100 10/24/2021 1316   CO2 23 10/24/2021 1316   GLUCOSE 125 (H) 10/24/2021 1316   GLUCOSE 95 09/18/2019 2131   BUN 7 10/24/2021 1316   CREATININE 0.89 10/24/2021 1316   CALCIUM 8.5 (L) 10/24/2021 1316   PROT 7.3 10/24/2021 1316   ALBUMIN 4.2 10/24/2021 1316   AST 23 10/24/2021 1316   ALT 22 10/24/2021 1316   ALKPHOS 120 10/24/2021 1316   BILITOT 0.3 10/24/2021 1316   GFRNONAA >60 09/18/2019 2131   GFRAA >60 09/18/2019 2131       Component Value Date/Time   WBC 11.3 (H) 06/09/2020 1616   WBC 7.2 09/18/2019 2131    RBC 4.00 06/09/2020 1616   RBC 4.73 09/18/2019 2131   HGB 12.6 06/09/2020 1616   HCT 37.3 06/09/2020 1616   PLT 327 06/09/2020 1616   MCV 93 06/09/2020 1616   MCH 31.5 06/09/2020 1616   MCH 30.7 09/18/2019 2131   MCHC 33.8 06/09/2020 1616   MCHC 33.2 09/18/2019 2131   RDW 12.1 06/09/2020 1616   LYMPHSABS 2.6 09/18/2019 2131   MONOABS 0.5 09/18/2019 2131   EOSABS 0.1 09/18/2019 2131   BASOSABS 0.0 09/18/2019 2131    No results found for: "POCLITH", "LITHIUM"   No results found for: "PHENYTOIN", "PHENOBARB", "VALPROATE", "CBMZ"   .res Assessment: Plan:    Plan:  PDMP reviewed  Lexapro '20mg'$  daily Adderall  XR '20mg'$  BID   [facility] ADHD screening - 47/58 ADHD   RTC 6 months - - will call in 3 months for next set of refills.   Time spent with patient was 20 minutes. Greater than 50% of face to face time with patient was spent on counseling and coordination of care.  Patient advised to contact office with any questions, adverse effects, or acute worsening in signs and symptoms.  Discussed potential benefits, risks, and side effects of stimulants with patient to include increased heart rate, palpitations, insomnia, increased anxiety, increased irritability, or decreased appetite. Instructed patient to contact office if experiencing any significant tolerability issues.  Diagnoses and all orders for this visit:  Attention deficit hyperactivity disorder (ADHD), unspecified ADHD type -     amphetamine-dextroamphetamine (ADDERALL XR) 20 MG 24 hr capsule; Take 1 capsule (20 mg total) by mouth 2 (two) times daily. -     amphetamine-dextroamphetamine (ADDERALL XR) 20 MG 24 hr capsule; Take 1 capsule (20 mg total) by mouth 2 (two) times daily. -     amphetamine-dextroamphetamine (ADDERALL XR) 20 MG 24 hr capsule; Take 1 capsule (20 mg total) by mouth 2 (two) times daily.  Generalized anxiety disorder -     escitalopram (LEXAPRO) 20 MG tablet; Take 1 tablet (20 mg total) by mouth  daily.     Please see After Visit Summary for patient specific instructions.  Future Appointments  Date Time Provider [place]  02/14/2022  5:00 PM [facility] 3 [facility]  03/01/2022 11:00 AM [name], FNP [facility] None    No orders of the defined types were placed in this encounter.   -------------------------------

## 2022-01-24 ENCOUNTER — Encounter: Payer: Self-pay | Admitting: Nurse Practitioner

## 2022-01-26 ENCOUNTER — Encounter: Payer: Self-pay | Admitting: Nurse Practitioner

## 2022-01-26 ENCOUNTER — Ambulatory Visit (INDEPENDENT_AMBULATORY_CARE_PROVIDER_SITE_OTHER): Payer: 59 | Admitting: Nurse Practitioner

## 2022-01-26 VITALS — BP 128/70 | HR 96 | Temp 98.1°F | Ht 67.0 in | Wt 318.0 lb

## 2022-01-26 DIAGNOSIS — J40 Bronchitis, not specified as acute or chronic: Secondary | ICD-10-CM | POA: Diagnosis not present

## 2022-01-26 DIAGNOSIS — N898 Other specified noninflammatory disorders of vagina: Secondary | ICD-10-CM | POA: Diagnosis not present

## 2022-01-26 MED ORDER — ALBUTEROL SULFATE HFA 108 (90 BASE) MCG/ACT IN AERS: 2.0000 | INHALATION_SPRAY | Freq: Four times a day (QID) | RESPIRATORY_TRACT | 2 refills | Status: DC | PRN

## 2022-01-26 MED ORDER — FLUCONAZOLE 100 MG PO TABS: 100.0000 mg | ORAL_TABLET | Freq: Every day | ORAL | 0 refills | Status: DC

## 2022-01-26 MED ORDER — PREDNISONE 10 MG (21) PO TBPK: ORAL_TABLET | ORAL | 0 refills | Status: DC

## 2022-01-26 NOTE — Patient Instructions (Signed)

## 2022-01-26 NOTE — Progress Notes (Signed)
I,Tianna Badgett,acting as a Education administrator for [employer], FNP.,have documented all relevant documentation on the behalf of [name], FNP,as directed by  [name], FNP while in the presence of [name], [place].  Subjective:     Patient ID: Rhonda Murray , female    DOB: 1975-03-20 , 47 y.o.   MRN: [id number]   Chief Complaint  Patient presents with   Cough    HPI  Patient presents today for cough since 01/12/2022 and was seen at a virtual visit - she was treated with augmentin and tessalon perles. She has been coughing up yellow and sometimes white secretions and vaginal itching since she started taking the antibiotic  Cough This is a new problem. The current episode started in the past 7 days. The cough is Productive of sputum. Associated symptoms include chest pain (when taking a deep breath). Pertinent negatives include no fever. Associated symptoms comments: Right eye "blood vessel". She has tried OTC cough suppressant (Sudafed, augmentin and tessalon perles) for the symptoms. There is no history of asthma.     Past Medical History:  Diagnosis Date   Anxiety unknown   COVID-19 virus infection 09/2019   Morbid obesity with BMI of 45.0-49.9, adult (HCC)    Sleep apnea      Family History  Problem Relation Age of Onset   Stroke Mother    Hypertension Mother      Current Outpatient Medications:    amoxicillin-clavulanate (AUGMENTIN) 875-125 MG tablet, Take 1 tablet by mouth every 12 (twelve) hours., Disp: 14 tablet, Rfl: 0   amphetamine-dextroamphetamine (ADDERALL XR) 20 MG 24 hr capsule, Take 1 capsule (20 mg total) by mouth 2 (two) times daily., Disp: 60 capsule, Rfl: 0   benzonatate (TESSALON) 100 MG capsule, Take 1 capsule (100 mg total) by mouth 2 (two) times daily as needed for cough., Disp: 20 capsule, Rfl: 0   cyclobenzaprine (FLEXERIL) 10 MG tablet, Take 1 tablet (10 mg total) by mouth 3 (three) times daily as needed for muscle spasms., Disp: 30 tablet, Rfl: 0    escitalopram (LEXAPRO) 20 MG tablet, Take 1 tablet (20 mg total) by mouth daily., Disp: 30 tablet, Rfl: 5   fluconazole (DIFLUCAN) 100 MG tablet, Take 1 tablet (100 mg total) by mouth daily. Take 1 tablet by mouth now repeat in 5 days, Disp: 2 tablet, Rfl: 0   metFORMIN (GLUCOPHAGE) 500 MG tablet, Take 1 tablet (500 mg total) by mouth 2 (two) times daily with a meal., Disp: 180 tablet, Rfl: 1   predniSONE (STERAPRED UNI-PAK 21 TAB) 10 MG (21) TBPK tablet, Take as directed, Disp: 21 tablet, Rfl: 0   albuterol (VENTOLIN HFA) 108 (90 Base) MCG/ACT inhaler, Inhale 2 puffs into the lungs every 6 (six) hours as needed for wheezing or shortness of breath., Disp: 8 g, Rfl: 2   [START ON 02/13/2022] amphetamine-dextroamphetamine (ADDERALL XR) 20 MG 24 hr capsule, Take 1 capsule (20 mg total) by mouth 2 (two) times daily., Disp: 60 capsule, Rfl: 0   [START ON 03/13/2022] amphetamine-dextroamphetamine (ADDERALL XR) 20 MG 24 hr capsule, Take 1 capsule (20 mg total) by mouth 2 (two) times daily., Disp: 60 capsule, Rfl: 0   No Known Allergies   Review of Systems  Constitutional:  Negative for fever.  Eyes: Negative.   Respiratory:  Positive for cough.   Cardiovascular:  Positive for chest pain (when taking a deep breath).  Genitourinary:  Positive for vaginal discharge.  Neurological: Negative.   Psychiatric/Behavioral: Negative.  Today's Vitals   01/26/22 1005  BP: 128/70  Pulse: 96  Temp: 98.1 F (36.7 C)  TempSrc: Oral  Weight: (!) 318 lb (144.2 kg)  Height: '5\' 7"'$  (1.702 m)   Body mass index is 49.81 kg/m.   Objective:  Physical Exam Vitals reviewed.  Constitutional:      General: She is not in acute distress.    Appearance: She is well-developed. She is obese.  HENT:     Head: Normocephalic and atraumatic.     Right Ear: Tympanic membrane, ear canal and external ear normal. There is no impacted cerumen.     Left Ear: Tympanic membrane, ear canal and external ear normal. There is no  impacted cerumen.     Nose: Nose normal. No congestion.     Mouth/Throat:     Mouth: Mucous membranes are moist.  Eyes:     Pupils: Pupils are equal, round, and reactive to light.  Cardiovascular:     Pulses: Normal pulses.     Heart sounds: Normal heart sounds. No murmur heard. Pulmonary:     Effort: Pulmonary effort is normal. No respiratory distress.     Breath sounds: Examination of the right-lower field reveals decreased breath sounds. Examination of the left-lower field reveals decreased breath sounds. Decreased breath sounds present. No wheezing.  Chest:     Chest wall: No tenderness.  Musculoskeletal:     Cervical back: Normal range of motion and neck supple.  Skin:    General: Skin is warm and dry.     Capillary Refill: Capillary refill takes less than 2 seconds.  Neurological:     Mental Status: She is alert and oriented to person, place, and time.  Psychiatric:        Mood and Affect: Mood normal. Mood is not anxious.        Speech: Speech normal.        Behavior: Behavior normal.         Assessment And Plan:     1. Bronchitis Comments: If not better after completion of prednisone or symptoms worsen call to office will order CXR - predniSONE (STERAPRED UNI-PAK 21 TAB) 10 MG (21) TBPK tablet; Take as directed  Dispense: 21 tablet; Refill: 0 - albuterol (VENTOLIN HFA) 108 (90 Base) MCG/ACT inhaler; Inhale 2 puffs into the lungs every 6 (six) hours as needed for wheezing or shortness of breath.  Dispense: 8 g; Refill: 2  2. Vaginal itching Comments: Likely related to antibiotics, will treat with Diflucan and check for any STDs, yeast or bacterial vaginosis - Urine cytology ancillary only - fluconazole (DIFLUCAN) 100 MG tablet; Take 1 tablet (100 mg total) by mouth daily. Take 1 tablet by mouth now repeat in 5 days  Dispense: 2 tablet; Refill: 0     Patient was given opportunity to ask questions. Patient verbalized understanding of the plan and was able to repeat key  elements of the plan. All questions were answered to their satisfaction.  [name], FNP   I, [name], FNP, have reviewed all documentation for this visit. The documentation on 01/26/22 for the exam, diagnosis, procedures, and orders are all accurate and complete.   IF YOU HAVE BEEN REFERRED TO A SPECIALIST, IT MAY TAKE 1-2 WEEKS TO SCHEDULE/PROCESS THE REFERRAL. IF YOU HAVE NOT HEARD FROM US/SPECIALIST IN TWO WEEKS, PLEASE GIVE [place] A CALL AT [phone].   THE PATIENT IS ENCOURAGED TO PRACTICE SOCIAL DISTANCING DUE TO THE COVID-19 PANDEMIC.

## 2022-02-14 ENCOUNTER — Ambulatory Visit: Payer: 59

## 2022-02-27 DIAGNOSIS — O99210 Obesity complicating pregnancy, unspecified trimester: Secondary | ICD-10-CM

## 2022-02-27 DIAGNOSIS — O09529 Supervision of elderly multigravida, unspecified trimester: Secondary | ICD-10-CM | POA: Insufficient documentation

## 2022-02-27 DIAGNOSIS — Z8742 Personal history of other diseases of the female genital tract: Secondary | ICD-10-CM | POA: Insufficient documentation

## 2022-02-27 HISTORY — DX: Obesity complicating pregnancy, unspecified trimester: O99.210

## 2022-02-27 HISTORY — DX: Supervision of elderly multigravida, unspecified trimester: O09.529

## 2022-03-01 ENCOUNTER — Encounter: Payer: 59 | Admitting: Nurse Practitioner

## 2022-07-17 ENCOUNTER — Ambulatory Visit: Payer: Medicaid Other | Admitting: Adult Health

## 2022-07-17 ENCOUNTER — Encounter: Payer: Self-pay | Admitting: Adult Health

## 2022-07-17 DIAGNOSIS — F909 Attention-deficit hyperactivity disorder, unspecified type: Secondary | ICD-10-CM | POA: Diagnosis not present

## 2022-07-17 DIAGNOSIS — F411 Generalized anxiety disorder: Secondary | ICD-10-CM

## 2022-07-17 MED ORDER — AMPHETAMINE-DEXTROAMPHET ER 20 MG PO CP24: 20.0000 mg | ORAL_CAPSULE | Freq: Two times a day (BID) | ORAL | 0 refills | Status: AC

## 2022-07-17 MED ORDER — ESCITALOPRAM OXALATE 20 MG PO TABS: 20.0000 mg | ORAL_TABLET | Freq: Every day | ORAL | 5 refills | Status: AC

## 2022-07-17 MED ORDER — AMPHETAMINE-DEXTROAMPHET ER 20 MG PO CP24: 20.0000 mg | ORAL_CAPSULE | Freq: Two times a day (BID) | ORAL | 0 refills | Status: DC

## 2022-07-17 NOTE — Progress Notes (Signed)
Rhonda Murray [id number] Oct 16, 1975 47 y.o.  Subjective:   Patient ID:  Rhonda Murray is a 47 y.o. (DOB 28-Jun-1975) female.  Chief Complaint: No chief complaint on file.   HPI Rhonda Murray presents to the office today for follow-up of ADHD and GAD.  Describes mood today as "ok". Pleasant. Mood symptoms - reports some depression, anxiety, and irritability. Reports one recent panic attack. Reports worry, rumination, and over thinking. Hoping to find a job soon - feeling useless. Reports some skin picking. Mood is "very up and down". Recently restarted medications - ran out - without insurance. Stating "I'm feeling a little better". Feels like medications are helpful. Stable interest and motivation. Taking medications as prescribed. Energy levels lower. Active, does not have a regular exercise routine.  Enjoys some usual interests and activities. Single. Has 2 children. She and significant other are still living together. Spending time with family. Appetite adequate. Weight gain. Sleeps well most nights. Averages 3 to 4 hours without medication. Focus and concentration improved with Adderall. Completing tasks. Managing aspects of household. Currently laid off and looking for employment. Denies SI or HI.  Denies AH or VH. Denies self harm. Denies substance use.  Previous medication trials:  Wellbutrin, Zoloft   PHQ2-9    Flowsheet Row Office Visit from 01/26/2022 in [facility] Office Visit from 10/24/2021 in [facility] Office Visit from 08/12/2020 in [facility] Office Visit from 08/12/2019 in [facility] Office Visit from 04/28/2019 in [facility]  PHQ-2 Total Score 1 1 0 0 0  PHQ-9 Total Score -- 7 0 1 0        Review of Systems:  Review of Systems  Musculoskeletal:  Negative for gait problem.  Neurological:   Negative for tremors.  Psychiatric/Behavioral:         Please refer to HPI    Medications: I have reviewed the patient's current medications.  Current Outpatient Medications  Medication Sig Dispense Refill   albuterol (VENTOLIN HFA) 108 (90 Base) MCG/ACT inhaler Inhale 2 puffs into the lungs every 6 (six) hours as needed for wheezing or shortness of breath. 8 g 2   amphetamine-dextroamphetamine (ADDERALL XR) 20 MG 24 hr capsule Take 1 capsule (20 mg total) by mouth 2 (two) times daily. 60 capsule 0   amphetamine-dextroamphetamine (ADDERALL XR) 20 MG 24 hr capsule Take 1 capsule (20 mg total) by mouth 2 (two) times daily. 60 capsule 0   amphetamine-dextroamphetamine (ADDERALL XR) 20 MG 24 hr capsule Take 1 capsule (20 mg total) by mouth 2 (two) times daily. 60 capsule 0   cyclobenzaprine (FLEXERIL) 10 MG tablet Take 1 tablet (10 mg total) by mouth 3 (three) times daily as needed for muscle spasms. 30 tablet 0   escitalopram (LEXAPRO) 20 MG tablet Take 1 tablet (20 mg total) by mouth daily. 30 tablet 5   metFORMIN (GLUCOPHAGE) 500 MG tablet Take 1 tablet (500 mg total) by mouth 2 (two) times daily with a meal. 180 tablet 1   No current facility-administered medications for this visit.    Medication Side Effects: None  Allergies: No Known Allergies  Past Medical History:  Diagnosis Date   Anxiety unknown   COVID-19 virus infection 09/2019   Morbid obesity with BMI of 45.0-49.9, adult [facility])    Sleep apnea     Past Medical History, Surgical history, Social history, and Family history were reviewed and  updated as appropriate.   Please see review of systems for further details on the patient's review from today.   Objective:   Physical Exam:  There were no vitals taken for this visit.  Physical Exam Constitutional:      General: She is not in acute distress. Musculoskeletal:        General: No deformity.  Neurological:     Mental Status: She is alert and oriented to person,  place, and time.     Coordination: Coordination normal.  Psychiatric:        Attention and Perception: Attention and perception normal. She does not perceive auditory or visual hallucinations.        Mood and Affect: Affect is not labile, blunt, angry or inappropriate.        Speech: Speech normal.        Behavior: Behavior normal.        Thought Content: Thought content normal. Thought content is not paranoid or delusional. Thought content does not include homicidal or suicidal ideation. Thought content does not include homicidal or suicidal plan.        Cognition and Memory: Cognition and memory normal.        Judgment: Judgment normal.     Comments: Insight intact     Lab Review:     Component Value Date/Time   NA 137 10/24/2021 1316   K 3.5 10/24/2021 1316   CL 100 10/24/2021 1316   CO2 23 10/24/2021 1316   GLUCOSE 125 (H) 10/24/2021 1316   GLUCOSE 95 09/18/2019 2131   BUN 7 10/24/2021 1316   CREATININE 0.89 10/24/2021 1316   CALCIUM 8.5 (L) 10/24/2021 1316   PROT 7.3 10/24/2021 1316   ALBUMIN 4.2 10/24/2021 1316   AST 23 10/24/2021 1316   ALT 22 10/24/2021 1316   ALKPHOS 120 10/24/2021 1316   BILITOT 0.3 10/24/2021 1316   GFRNONAA >60 09/18/2019 2131   GFRAA >60 09/18/2019 2131       Component Value Date/Time   WBC 11.3 (H) 06/09/2020 1616   WBC 7.2 09/18/2019 2131   RBC 4.00 06/09/2020 1616   RBC 4.73 09/18/2019 2131   HGB 12.6 06/09/2020 1616   HCT 37.3 06/09/2020 1616   PLT 327 06/09/2020 1616   MCV 93 06/09/2020 1616   MCH 31.5 06/09/2020 1616   MCH 30.7 09/18/2019 2131   MCHC 33.8 06/09/2020 1616   MCHC 33.2 09/18/2019 2131   RDW 12.1 06/09/2020 1616   LYMPHSABS 2.6 09/18/2019 2131   MONOABS 0.5 09/18/2019 2131   EOSABS 0.1 09/18/2019 2131   BASOSABS 0.0 09/18/2019 2131    No results found for: "POCLITH", "LITHIUM"   No results found for: "PHENYTOIN", "PHENOBARB", "VALPROATE", "CBMZ"   .res Assessment: Plan:    Plan:  PDMP  reviewed  Lexapro 20mg  daily Adderall XR 20mg  BID   [facility] ADHD screening - 47/58 ADHD   RTC 6 months - will call in 3 months for next set of refills.   Time spent with patient was 20 minutes. Greater than 50% of face to face time with patient was spent on counseling and coordination of care.  Patient advised to contact office with any questions, adverse effects, or acute worsening in signs and symptoms.  Discussed potential benefits, risks, and side effects of stimulants with patient to include increased heart rate, palpitations, insomnia, increased anxiety, increased irritability, or decreased appetite. Instructed patient to contact office if experiencing any significant tolerability issues.   Diagnoses and all orders  for this visit:  Generalized anxiety disorder  Attention deficit hyperactivity disorder (ADHD), unspecified ADHD type     Please see After Visit Summary for patient specific instructions.  Future Appointments  Date Time Provider Department Center  07/27/2022  4:00 PM [name], FNP [facility] None    No orders of the defined types were placed in this encounter.   -------------------------------

## 2022-07-27 ENCOUNTER — Encounter: Payer: Self-pay | Admitting: Nurse Practitioner

## 2022-07-27 ENCOUNTER — Ambulatory Visit (INDEPENDENT_AMBULATORY_CARE_PROVIDER_SITE_OTHER): Payer: Medicaid Other | Admitting: Nurse Practitioner

## 2022-07-27 VITALS — BP 120/70 | HR 104 | Temp 98.2°F | Ht 67.0 in | Wt 322.2 lb

## 2022-07-27 DIAGNOSIS — Z6841 Body Mass Index (BMI) 40.0 and over, adult: Secondary | ICD-10-CM

## 2022-07-27 DIAGNOSIS — Z13220 Encounter for screening for lipoid disorders: Secondary | ICD-10-CM

## 2022-07-27 DIAGNOSIS — Z0000 Encounter for general adult medical examination without abnormal findings: Secondary | ICD-10-CM | POA: Diagnosis not present

## 2022-07-27 DIAGNOSIS — N3941 Urge incontinence: Secondary | ICD-10-CM

## 2022-07-27 DIAGNOSIS — R41840 Attention and concentration deficit: Secondary | ICD-10-CM | POA: Diagnosis not present

## 2022-07-27 DIAGNOSIS — Z79899 Other long term (current) drug therapy: Secondary | ICD-10-CM

## 2022-07-27 DIAGNOSIS — R12 Heartburn: Secondary | ICD-10-CM

## 2022-07-27 DIAGNOSIS — E559 Vitamin D deficiency, unspecified: Secondary | ICD-10-CM | POA: Diagnosis not present

## 2022-07-27 DIAGNOSIS — R7309 Other abnormal glucose: Secondary | ICD-10-CM | POA: Diagnosis not present

## 2022-07-27 DIAGNOSIS — F419 Anxiety disorder, unspecified: Secondary | ICD-10-CM | POA: Diagnosis not present

## 2022-07-27 DIAGNOSIS — F32A Depression, unspecified: Secondary | ICD-10-CM

## 2022-07-27 DIAGNOSIS — Z1211 Encounter for screening for malignant neoplasm of colon: Secondary | ICD-10-CM | POA: Insufficient documentation

## 2022-07-27 MED ORDER — OMEPRAZOLE 20 MG PO CPDR: 20.0000 mg | DELAYED_RELEASE_CAPSULE | Freq: Every day | ORAL | 2 refills | Status: DC

## 2022-07-27 NOTE — Assessment & Plan Note (Signed)
According to USPTF Colorectal cancer Screening guidelines. Colonoscopy is recommended every 10 years, starting at age 45 years.

## 2022-07-27 NOTE — Assessment & Plan Note (Signed)
Recently started on adderall is to f/u next month

## 2022-07-27 NOTE — Assessment & Plan Note (Signed)
Continue current medications, tolerating well.

## 2022-07-27 NOTE — Assessment & Plan Note (Signed)
She has tried over-the-counter Pepcid has been ineffective, will send prescription for omeprazole

## 2022-07-27 NOTE — Assessment & Plan Note (Signed)
She is encouraged to strive for BMI less than 30 to decrease cardiac risk. Advised to aim for at least 150 minutes of exercise per week.  Unfortunately she has gained approximately 15 pounds since October.  This is likely related to her depression.  Encouraged to at least walk 30 minutes/day 5 days a week.  Will consider starting her on phentermine once she has discussed with her behavioral health specialist.

## 2022-07-27 NOTE — Assessment & Plan Note (Signed)
Behavior modifications discussed and diet history reviewed.   Pt will continue to exercise regularly and modify diet with low GI, plant based foods and decrease intake of processed foods.  Recommend intake of daily multivitamin, Vitamin D, and calcium.  Recommend mammogram and colonoscopy (referral sent) for preventive screenings, as well as recommend immunizations that include influenza, TDAP

## 2022-07-27 NOTE — Assessment & Plan Note (Signed)
Continue f/u with behavioral health.

## 2022-07-27 NOTE — Progress Notes (Signed)
[name], [name],acting as a Neurosurgeon for Rhonda Felts, FNP.,have documented all relevant documentation on the behalf of Rhonda Felts, FNP,as directed by  Rhonda Felts, FNP while in the presence of Rhonda Felts, FNP.  Subjective:    Patient ID: Rhonda Murray , female    DOB: June 10, 1975 , 47 y.o.   MRN: [id number]  Chief Complaint  Patient presents with   Annual Exam    HPI  Patient presents today for HM. Patient reports compliance with medications. Patient denies any headaches. Patient reports here recently her bladder has been weak, she reports she can't hold it. She reports it started after a bad cold. She has recently been approved for food stamps. They will also go to the food pantry. She reports it is slightly better within the last 2/3 weeks. Patient is established with a GYN.  Reports intermittent pain to her chest when laying down.  Denies shortness of breath,  BP Readings from Last 3 Encounters: 07/27/22 : 120/70 01/26/22 : 128/70 10/24/21 : 130/70   Wt Readings from Last 3 Encounters: 07/27/22 : (!) 322 lb 3.2 oz (146.1 kg) 01/26/22 : (!) 318 lb (144.2 kg) 10/24/21 : (!) 311 lb 9.6 oz (141.3 kg)  She is in a study with [employer] with a life coach, fit bit and scale. Given module. Goal to lose 23 lbs for one year.       Past Medical History:  Diagnosis Date   Anxiety unknown   COVID-19 virus infection 09/2019   Maternal obesity affecting pregnancy, antepartum 02/27/2022   Morbid obesity with BMI of 45.0-49.9, adult (HCC)    Multigravida of advanced maternal age 19/19/2024   declined Panorama     Nocturia 03/18/2018   Right lower quadrant abdominal pain 03/18/2018   Sleep apnea    Tingling of skin 03/18/2018     Family History  Problem Relation Age of Onset   Stroke Mother    Hypertension Mother      Current Outpatient Medications:    albuterol (VENTOLIN HFA) 108 (90 Base) MCG/ACT inhaler, Inhale 2 puffs into the lungs every 6 (six) hours as needed for  wheezing or shortness of breath., Disp: 8 g, Rfl: 2   amphetamine-dextroamphetamine (ADDERALL XR) 20 MG 24 hr capsule, Take 1 capsule (20 mg total) by mouth 2 (two) times daily., Disp: 60 capsule, Rfl: 0   escitalopram (LEXAPRO) 20 MG tablet, Take 1 tablet (20 mg total) by mouth daily., Disp: 30 tablet, Rfl: 5   metFORMIN (GLUCOPHAGE) 500 MG tablet, Take 1 tablet (500 mg total) by mouth 2 (two) times daily with a meal., Disp: 180 tablet, Rfl: 1   omeprazole (PRILOSEC) 20 MG capsule, Take 1 capsule (20 mg total) by mouth daily., Disp: 30 capsule, Rfl: 2   [START ON 08/14/2022] amphetamine-dextroamphetamine (ADDERALL XR) 20 MG 24 hr capsule, Take 1 capsule (20 mg total) by mouth 2 (two) times daily., Disp: 60 capsule, Rfl: 0   No Known Allergies    The patient states she uses IUD for birth control. No LMP recorded (lmp unknown). (Menstrual status: IUD).  Negative for: breast discharge, breast lump(s), breast pain and breast self exam. Associated symptoms include abnormal vaginal bleeding. Pertinent negatives include abnormal bleeding (hematology), anxiety, decreased libido, depression, difficulty falling sleep, dyspareunia, history of infertility, nocturia, sexual dysfunction, sleep disturbances, urinary incontinence, urinary urgency, vaginal discharge and vaginal itching. Diet regular; has not been eating a lot of sweets, has not been eating. Will eat breakfast sometimes, will skip lunch  and will eat dinner. She has not been snacking a lot. Has not had much of an appetite. The patient states her exercise level is minimal due to dyspnea.   The patient's tobacco use is:  Social History   Tobacco Use  Smoking Status Former  Smokeless Tobacco Never   She has been exposed to passive smoke. The patient's alcohol use is:  Social History   Substance and Sexual Activity  Alcohol Use No   Additional information: Last pap 08/13/2020, next one scheduled for 08/14/2023.    Review of Systems  Constitutional:  Negative.   HENT: Negative.    Eyes: Negative.   Respiratory: Negative.    Cardiovascular:  Positive for chest pain (heart burn when lying flat).  Gastrointestinal: Negative.   Endocrine: Negative.   Genitourinary: Negative.        Bladder weak  Musculoskeletal: Negative.   Skin: Negative.   Allergic/Immunologic: Negative.   Neurological: Negative.   Hematological: Negative.   Psychiatric/Behavioral: Negative.       Today's Vitals   07/27/22 1512  BP: 120/70  Pulse: (!) 104  Temp: 98.2 F (36.8 C)  TempSrc: Oral  Weight: (!) 322 lb 3.2 oz (146.1 kg)  Height: 5\' 7"  (1.702 m)  PainSc: 0-No pain   Body mass index is 50.46 kg/m.  Wt Readings from Last 3 Encounters:  07/27/22 (!) 322 lb 3.2 oz (146.1 kg)  01/26/22 (!) 318 lb (144.2 kg)  10/24/21 (!) 311 lb 9.6 oz (141.3 kg)     Objective:  Physical Exam Vitals reviewed.  Constitutional:      General: She is not in acute distress.    Appearance: Normal appearance. She is well-developed. She is obese.  HENT:     Head: Normocephalic and atraumatic.     Right Ear: Hearing, tympanic membrane, ear canal and external ear normal. There is no impacted cerumen.     Left Ear: Hearing, tympanic membrane, ear canal and external ear normal. There is no impacted cerumen.     Nose: Nose normal.     Mouth/Throat:     Mouth: Mucous membranes are moist.  Eyes:     General: Lids are normal.     Extraocular Movements: Extraocular movements intact.     Conjunctiva/sclera: Conjunctivae normal.     Pupils: Pupils are equal, round, and reactive to light.     Funduscopic exam:    Right eye: No papilledema.        Left eye: No papilledema.  Neck:     Thyroid: No thyroid mass.     Vascular: No carotid bruit.  Cardiovascular:     Rate and Rhythm: Normal rate and regular rhythm.     Pulses: Normal pulses.     Heart sounds: Normal heart sounds. No murmur heard. Pulmonary:     Effort: Pulmonary effort is normal. No respiratory distress.      Breath sounds: Normal breath sounds. No wheezing.  Chest:     Chest wall: No mass.  Breasts:    Tanner Score is 5.     Right: Normal. No mass or tenderness.     Left: Normal. No mass or tenderness.  Abdominal:     General: Abdomen is flat. Bowel sounds are normal. There is no distension.     Palpations: Abdomen is soft.     Tenderness: There is no abdominal tenderness.  Genitourinary:    Rectum: Guaiac result negative.  Musculoskeletal:        General: No swelling. Normal  range of motion.     Cervical back: Full passive range of motion without pain, normal range of motion and neck supple.     Right lower leg: No edema.     Left lower leg: No edema.  Lymphadenopathy:     Upper Body:     Right upper body: No supraclavicular, axillary or pectoral adenopathy.     Left upper body: No supraclavicular, axillary or pectoral adenopathy.  Skin:    General: Skin is warm and dry.     Capillary Refill: Capillary refill takes less than 2 seconds.  Neurological:     General: No focal deficit present.     Mental Status: She is alert and oriented to person, place, and time.     Cranial Nerves: No cranial nerve deficit.     Sensory: No sensory deficit.  Psychiatric:        Mood and Affect: Mood normal.        Behavior: Behavior normal.        Thought Content: Thought content normal.        Judgment: Judgment normal.         Assessment And Plan:     Encounter for annual health examination Assessment & Plan: Behavior modifications discussed and diet history reviewed.   Pt will continue to exercise regularly and modify diet with low GI, plant based foods and decrease intake of processed foods.  Recommend intake of daily multivitamin, Vitamin D, and calcium.  Recommend mammogram and colonoscopy (referral sent) for preventive screenings, as well as recommend immunizations that include influenza, TDAP    Encounter for screening for lipid disorder -     Lipid panel  Morbid obesity  with BMI of 50.0-59.9, adult [facility]) Assessment & Plan: She is encouraged to strive for BMI less than 30 to decrease cardiac risk. Advised to aim for at least 150 minutes of exercise per week.  Unfortunately she has gained approximately 15 pounds since October.  This is likely related to her depression.  Encouraged to at least walk 30 minutes/day 5 days a week.  Will consider starting her on phentermine once she has discussed with her behavioral health specialist.    Screening for colon cancer Assessment & Plan: According to USPTF Colorectal cancer Screening guidelines. Colonoscopy is recommended every 10 years, starting at age 72 years.    Orders: -     Ambulatory referral to Gastroenterology  Abnormal glucose Assessment & Plan: Continue current medications, tolerating well.   Orders: -     CMP14+EGFR -     Hemoglobin A1c  Vitamin D deficiency -     VITAMIN D 25 Hydroxy (Vit-D Deficiency, Fractures)  Concentration deficit Assessment & Plan: Recently started on adderall is to f/u next month   Anxiety and depression Assessment & Plan: Continue f/u with behavioral health.    Urge incontinence of urine -     Ambulatory referral to Physical Therapy  Heart burn Assessment & Plan: She has tried over-the-counter Pepcid has been ineffective, will send prescription for omeprazole  Orders: -     Omeprazole; Take 1 capsule (20 mg total) by mouth daily.  Dispense: 30 capsule; Refill: 2  Other long term (current) drug therapy -     CBC with Differential/Platelet    Return for 6 week f/u omeprazole, 1 year HM. Patient was given opportunity to ask questions. Patient verbalized understanding of the plan and was able to repeat key elements of the plan. All questions were answered to their  satisfaction.   Rhonda Felts, FNP  I, Rhonda Felts, FNP, have reviewed all documentation for this visit. The documentation on 07/27/22 for the exam, diagnosis, procedures, and orders are all  accurate and complete.

## 2022-07-28 LAB — CBC WITH DIFFERENTIAL/PLATELET
Basophils Absolute: 0 10*3/uL (ref 0.0–0.2)
Basos: 0 %
EOS (ABSOLUTE): 0.1 10*3/uL (ref 0.0–0.4)
Eos: 1 %
Hematocrit: 39.9 % (ref 34.0–46.6)
Hemoglobin: 13 g/dL (ref 11.1–15.9)
Immature Grans (Abs): 0 10*3/uL (ref 0.0–0.1)
Immature Granulocytes: 0 %
Lymphocytes Absolute: 2 10*3/uL (ref 0.7–3.1)
Lymphs: 25 %
MCH: 29.7 pg (ref 26.6–33.0)
MCHC: 32.6 g/dL (ref 31.5–35.7)
MCV: 91 fL (ref 79–97)
Monocytes Absolute: 0.4 10*3/uL (ref 0.1–0.9)
Monocytes: 5 %
Neutrophils Absolute: 5.7 10*3/uL (ref 1.4–7.0)
Neutrophils: 69 %
Platelets: 356 10*3/uL (ref 150–450)
RBC: 4.37 x10E6/uL (ref 3.77–5.28)
RDW: 13 % (ref 11.7–15.4)
WBC: 8.3 10*3/uL (ref 3.4–10.8)

## 2022-07-28 LAB — HEMOGLOBIN A1C
Est. average glucose Bld gHb Est-mCnc: 120 mg/dL
Hgb A1c MFr Bld: 5.8 % — ABNORMAL HIGH (ref 4.8–5.6)

## 2022-07-28 LAB — LIPID PANEL
Chol/HDL Ratio: 4 ratio (ref 0.0–4.4)
Cholesterol, Total: 186 mg/dL (ref 100–199)
HDL: 47 mg/dL (ref >39)
LDL Chol Calc (NIH): 116 mg/dL — ABNORMAL HIGH (ref 0–99)
Triglycerides: 128 mg/dL (ref 0–149)
VLDL Cholesterol Cal: 23 mg/dL (ref 5–40)

## 2022-07-28 LAB — CMP14+EGFR
ALT: 19 IU/L (ref 0–32)
AST: 19 IU/L (ref 0–40)
Albumin: 4.3 g/dL (ref 3.9–4.9)
Alkaline Phosphatase: 111 IU/L (ref 44–121)
BUN/Creatinine Ratio: 10 (ref 9–23)
BUN: 11 mg/dL (ref 6–24)
Bilirubin Total: 0.3 mg/dL (ref 0.0–1.2)
CO2: 23 mmol/L (ref 20–29)
Calcium: 9.5 mg/dL (ref 8.7–10.2)
Chloride: 101 mmol/L (ref 96–106)
Creatinine, Ser: 1.09 mg/dL — ABNORMAL HIGH (ref 0.57–1.00)
Globulin, Total: 3.6 g/dL (ref 1.5–4.5)
Glucose: 93 mg/dL (ref 70–99)
Potassium: 3.7 mmol/L (ref 3.5–5.2)
Sodium: 139 mmol/L (ref 134–144)
Total Protein: 7.9 g/dL (ref 6.0–8.5)
eGFR: 64 mL/min/{1.73_m2} (ref >59)

## 2022-07-28 LAB — VITAMIN D 25 HYDROXY (VIT D DEFICIENCY, FRACTURES): Vit D, 25-Hydroxy: 35.7 ng/mL (ref 30.0–100.0)

## 2022-08-28 ENCOUNTER — Ambulatory Visit: Payer: Medicaid Other | Admitting: Nurse Practitioner

## 2022-08-28 VITALS — BP 120/82 | HR 98 | Temp 98.4°F | Ht 67.0 in | Wt 319.0 lb

## 2022-08-28 DIAGNOSIS — R12 Heartburn: Secondary | ICD-10-CM | POA: Diagnosis not present

## 2022-08-28 DIAGNOSIS — Z6841 Body Mass Index (BMI) 40.0 and over, adult: Secondary | ICD-10-CM | POA: Diagnosis not present

## 2022-08-28 MED ORDER — WEGOVY 0.5 MG/0.5ML ~~LOC~~ SOAJ
0.5000 mg | SUBCUTANEOUS | 0 refills | Status: AC
Start: 2022-08-28 — End: ?

## 2022-08-28 NOTE — Assessment & Plan Note (Addendum)
Phentermine is a contraindication due to taking adderall. I will order her Reginal Lutes pending approval from her insurance. She is aware of how to take the medication and side effects

## 2022-08-28 NOTE — Progress Notes (Signed)
[name], [name],acting as a Neurosurgeon for [name], FNP.,have documented all relevant documentation on the behalf of [name], FNP,as directed by  [name], FNP while in the presence of [name], FNP.  Subjective:  Patient ID: Rhonda Murray , female    DOB: 1975-10-27 , 47 y.o.   MRN: [id number]  Chief Complaint  Patient presents with   Heartburn    HPI  Patient presents today for a heart burn follow up, patient reports compliance with medications. Patient denies any chest pain, SOB, or headaches. Patient has no other concerns today. Patient has been taking omeprazole 20mg  for her heart burn. Patient reports she hasn't had to take it everyday but when she does it helps a lot.   BP Readings from Last 3 Encounters: 08/28/22 : 120/82 07/27/22 : 120/70 01/26/22 : 128/70   Wt Readings from Last 3 Encounters: 08/28/22 : (!) 319 lb (144.7 kg) 07/27/22 : (!) 322 lb 3.2 oz (146.1 kg) 01/26/22 : (!) 318 lb (144.2 kg)  She has been walking more with the kids. She is not doing well with her diet. She is trying to ease up on the soda intake. She is not due to see psychiatry until later this month. Her provider does not want her to take phentermine, she has doubled up on her adderall. She has taken wegovy in the past. 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 She also has some new shoes [employer])     Past Medical History:  Diagnosis Date   Anxiety unknown   COVID-19 virus infection 09/2019   Maternal obesity affecting pregnancy, antepartum 02/27/2022   Morbid obesity with BMI of 45.0-49.9, adult (HCC)    Multigravida of advanced maternal age 53/19/2024   declined Panorama     Nocturia 03/18/2018   Right lower quadrant abdominal pain 03/18/2018   Sleep apnea    Tingling of skin 03/18/2018     Family History  Problem Relation Age of Onset   Stroke Mother    Hypertension Mother      Current Outpatient Medications:    albuterol (VENTOLIN HFA)  108 (90 Base) MCG/ACT inhaler, Inhale 2 puffs into the lungs every 6 (six) hours as needed for wheezing or shortness of breath., Disp: 8 g, Rfl: 2   amphetamine-dextroamphetamine (ADDERALL XR) 20 MG 24 hr capsule, Take 1 capsule (20 mg total) by mouth 2 (two) times daily., Disp: 60 capsule, Rfl: 0   amphetamine-dextroamphetamine (ADDERALL XR) 20 MG 24 hr capsule, Take 1 capsule (20 mg total) by mouth 2 (two) times daily., Disp: 60 capsule, Rfl: 0   escitalopram (LEXAPRO) 20 MG tablet, Take 1 tablet (20 mg total) by mouth daily., Disp: 30 tablet, Rfl: 5   metFORMIN (GLUCOPHAGE) 500 MG tablet, Take 1 tablet (500 mg total) by mouth 2 (two) times daily with a meal., Disp: 180 tablet, Rfl: 1   omeprazole (PRILOSEC) 20 MG capsule, Take 1 capsule (20 mg total) by mouth daily., Disp: 30 capsule, Rfl: 2   Semaglutide-Weight Management (WEGOVY) 0.5 MG/0.5ML SOAJ, Inject 0.5 mg into the skin once a week., Disp: 2 mL, Rfl: 0   No Known Allergies   Review of Systems  Constitutional: Negative.   HENT: Negative.    Eyes: Negative.   Respiratory: Negative.    Cardiovascular: Negative.  Negative for chest pain.  Gastrointestinal: Negative.   Neurological: Negative.   Psychiatric/Behavioral: Negative.       Today's Vitals   08/28/22  1149  BP: 120/82  Pulse: 98  Temp: 98.4 F (36.9 C)  TempSrc: Oral  Weight: (!) 319 lb (144.7 kg)  Height: 5\' 7"  (1.702 m)   Body mass index is 49.96 kg/m.  Wt Readings from Last 3 Encounters:  08/28/22 (!) 319 lb (144.7 kg)  07/27/22 (!) 322 lb 3.2 oz (146.1 kg)  01/26/22 (!) 318 lb (144.2 kg)     Objective:  Physical Exam Vitals reviewed.  Constitutional:      General: She is not in acute distress.    Appearance: She is well-developed. She is obese.  Pulmonary:     Effort: No respiratory distress.     Breath sounds: No wheezing.  Skin:    Capillary Refill: Capillary refill takes less than 2 seconds.  Neurological:     General: No focal deficit present.      Mental Status: She is alert and oriented to person, place, and time. Mental status is at baseline.     Cranial Nerves: No cranial nerve deficit.     Motor: No weakness.  Psychiatric:        Mood and Affect: Mood normal. Mood is not anxious.        Speech: Speech normal.        Behavior: Behavior normal.         Assessment And Plan:  Heart burn Assessment & Plan: Doing well with current regimen   Morbid obesity with BMI of 45.0-49.9, adult (HCC) Assessment & Plan: Phentermine is a contraindication due to taking adderall. I will order her Reginal Lutes pending approval from her insurance. She is aware of how to take the medication and side effects  Orders: -     ZOXWRU; Inject 0.5 mg into the skin once a week.  Dispense: 2 mL; Refill: 0    Return if symptoms worsen or fail to improve, for 6 months f/u prediabetes.  Patient was given opportunity to ask questions. Patient verbalized understanding of the plan and was able to repeat key elements of the plan. All questions were answered to their satisfaction.    Jeanell Sparrow, FNP, have reviewed all documentation for this visit. The documentation on 08/28/22 for the exam, diagnosis, procedures, and orders are all accurate and complete.   IF YOU HAVE BEEN REFERRED TO A SPECIALIST, IT MAY TAKE 1-2 WEEKS TO SCHEDULE/PROCESS THE REFERRAL. IF YOU HAVE NOT HEARD FROM US/SPECIALIST IN TWO WEEKS, PLEASE GIVE [place] A CALL AT [phone].

## 2022-09-04 NOTE — Assessment & Plan Note (Signed)
Doing well with current regimen. 

## 2022-11-07 NOTE — Therapy (Deleted)
OUTPATIENT PHYSICAL THERAPY FEMALE PELVIC EVALUATION   Patient Name: Rhonda Murray MRN: [id number] DOB:12/04/1975, 47 y.o., female Today's Date: 11/07/2022  END OF SESSION:   Past Medical History:  Diagnosis Date   Anxiety unknown   COVID-19 virus infection 09/2019   Maternal obesity affecting pregnancy, antepartum 02/27/2022   Morbid obesity with BMI of 45.0-49.9, adult (HCC)    Multigravida of advanced maternal age 21/19/2024   declined Panorama     Nocturia 03/18/2018   Right lower quadrant abdominal pain 03/18/2018   Sleep apnea    Tingling of skin 03/18/2018   Past Surgical History:  Procedure Laterality Date   CESAREAN SECTION N/A 11/09/2014   Procedure: CESAREAN SECTION;  Surgeon: Huel Cote, MD;  Location: [facility];  Service: Obstetrics;  Laterality: N/A;   LEEP     MASS EXCISION N/A 04/24/2019   Procedure: EXCISION OF ABDOMINAL WALL MASS;  Surgeon: Harriette Bouillon, MD;  Location: [facility];  Service: General;  Laterality: N/A;   Patient Active Problem List   Diagnosis Date Noted   Encounter for annual health examination 07/27/2022   Abnormal glucose 07/27/2022   Vitamin D deficiency 07/27/2022   Concentration deficit 07/27/2022   Screening for colon cancer 07/27/2022   Morbid obesity with BMI of 45.0-49.9, adult (HCC) 07/27/2022   Anxiety and depression 07/27/2022   Encounter for screening for lipid disorder 07/27/2022   Urge incontinence of urine 07/27/2022   Heart burn 07/27/2022   History of abnormal cervical Papanicolaou smear 02/27/2022   History of cesarean section, unknown scar 11/09/2014    PCP: [name], FNP  REFERRING PROVIDER: [name], FNP   REFERRING DIAG: N39.41 (ICD-10-CM) - Urge incontinence of urine   THERAPY DIAG:  No diagnosis found.  Rationale for Evaluation and Treatment: Rehabilitation  ONSET DATE: ***  SUBJECTIVE:                                                                                                                                                                                            SUBJECTIVE STATEMENT: *** Fluid intake: {Yes/No:304960894}   PAIN:  Are you having pain? {yes/no:20286} NPRS scale: ***/10 Pain location: {pelvic pain location:27098}  Pain type: {type:313116} Pain description: {PAIN DESCRIPTION:21022940}   Aggravating factors: *** Relieving factors: ***  PRECAUTIONS: {Therapy precautions:24002}  RED FLAGS: {PT Red Flags:29287}   WEIGHT BEARING RESTRICTIONS: No  FALLS:  Has patient fallen in last 6 months? {fallsyesno:27318}  LIVING ENVIRONMENT: Lives with: {[facility] lives with:25569::"lives with their family"} Lives in: {Lives in:25570} Stairs: {opstairs:27293} Has following equipment at home: {Assistive devices:23999}  OCCUPATION: ***  PLOF: {PLOF:24004}  PATIENT GOALS: ***  PERTINENT HISTORY:  *** Sexual abuse: {Yes/No:304960894}  BOWEL MOVEMENT: Pain with bowel movement: {yes/no:20286} Type of bowel movement:{PT BM type:27100} Fully empty rectum: {Yes/No:304960894} Leakage: {Yes/No:304960894} Pads: {Yes/No:304960894} Fiber supplement: {Yes/No:304960894}  URINATION: Pain with urination: {yes/no:20286} Fully empty bladder: {Yes/No:304960894} Stream: {PT urination:27102} Urgency: {Yes/No:304960894} Frequency: *** Leakage: {PT leakage:27103} Pads: {Yes/No:304960894}  INTERCOURSE: Pain with intercourse: {pain with intercourse PA:27099} Ability to have vaginal penetration:  {Yes/No:304960894} Climax: *** Marinoff Scale: ***/3  PREGNANCY: Vaginal deliveries *** Tearing {Yes***/No:304960894} C-section deliveries *** Currently pregnant {Yes***/No:304960894}  PROLAPSE: {PT prolapse:27101}   OBJECTIVE:  Note: Objective measures were completed at Evaluation unless otherwise noted.  DIAGNOSTIC FINDINGS:  ***  PATIENT SURVEYS:  {rehab surveys:24030}  PFIQ-7 ***  COGNITION: Overall cognitive status:  {cognition:24006}     SENSATION: Light touch: {intact/deficits:24005} Proprioception: {intact/deficits:24005}  MUSCLE LENGTH: Hamstrings: Right *** deg; Left *** deg Thomas test: Right *** deg; Left *** deg  LUMBAR SPECIAL TESTS:  {lumbar special test:25242}  FUNCTIONAL TESTS:  {Functional tests:24029}  GAIT: Distance walked: *** Assistive device utilized: {Assistive devices:23999} Level of assistance: {Levels of assistance:24026} Comments: ***  POSTURE: {posture:25561}  PELVIC ALIGNMENT:  LUMBARAROM/PROM:  A/PROM A/PROM  eval  Flexion   Extension   Right lateral flexion   Left lateral flexion   Right rotation   Left rotation    (Blank rows = not tested)  LOWER EXTREMITY ROM:  {AROM/PROM:27142} ROM Right eval Left eval  Hip flexion    Hip extension    Hip abduction    Hip adduction    Hip internal rotation    Hip external rotation    Knee flexion    Knee extension    Ankle dorsiflexion    Ankle plantarflexion    Ankle inversion    Ankle eversion     (Blank rows = not tested)  LOWER EXTREMITY MMT:  MMT Right eval Left eval  Hip flexion    Hip extension    Hip abduction    Hip adduction    Hip internal rotation    Hip external rotation    Knee flexion    Knee extension    Ankle dorsiflexion    Ankle plantarflexion    Ankle inversion    Ankle eversion     PALPATION:   General  ***                External Perineal Exam ***                             Internal Pelvic Floor ***  Patient confirms identification and approves PT to assess internal pelvic floor and treatment {yes/no:20286}  PELVIC MMT:   MMT eval  Vaginal   Internal Anal Sphincter   External Anal Sphincter   Puborectalis   Diastasis Recti   (Blank rows = not tested)        TONE: ***  PROLAPSE: ***  TODAY'S TREATMENT:  DATE: ***  EVAL  ***   PATIENT EDUCATION:  Education details: *** Person educated: {Person educated:25204} Education method: {Education Method:25205} Education comprehension: {Education Comprehension:25206}  HOME EXERCISE PROGRAM: ***  ASSESSMENT:  CLINICAL IMPRESSION: Patient is a *** y.o. *** who was seen today for physical therapy evaluation and treatment for ***.   OBJECTIVE IMPAIRMENTS: {opptimpairments:25111}.   ACTIVITY LIMITATIONS: {activitylimitations:27494}  PARTICIPATION LIMITATIONS: {participationrestrictions:25113}  PERSONAL FACTORS: {Personal factors:25162} are also affecting patient's functional outcome.   REHAB POTENTIAL: {rehabpotential:25112}  CLINICAL DECISION MAKING: {clinical decision making:25114}  EVALUATION COMPLEXITY: {Evaluation complexity:25115}   GOALS: Goals reviewed with patient? {yes/no:20286}  SHORT TERM GOALS: Target date: ***  *** Baseline: Goal status: INITIAL  2.  *** Baseline:  Goal status: INITIAL  3.  *** Baseline:  Goal status: INITIAL  4.  *** Baseline:  Goal status: INITIAL  5.  *** Baseline:  Goal status: INITIAL  6.  *** Baseline:  Goal status: INITIAL  LONG TERM GOALS: Target date: ***  *** Baseline:  Goal status: INITIAL  2.  *** Baseline:  Goal status: INITIAL  3.  *** Baseline:  Goal status: INITIAL  4.  *** Baseline:  Goal status: INITIAL  5.  *** Baseline:  Goal status: INITIAL  6.  *** Baseline:  Goal status: INITIAL  PLAN:  PT FREQUENCY: {rehab frequency:25116}  PT DURATION: {rehab duration:25117}  PLANNED INTERVENTIONS: {rehab planned interventions:25118::"97110-Therapeutic exercises","97530- Therapeutic [phone]- Neuromuscular re-education","97535- Self JXBJ","47829- Manual therapy"}  PLAN FOR NEXT SESSION: ***   Darcell Yacoub, PT 11/07/2022, 4:35 PM

## 2022-11-08 ENCOUNTER — Ambulatory Visit: Payer: Medicaid Other | Attending: Nurse Practitioner | Admitting: Physical Therapy

## 2022-11-13 ENCOUNTER — Encounter: Payer: 59 | Admitting: Physical Therapy

## 2022-11-20 ENCOUNTER — Encounter: Payer: 59 | Admitting: Physical Therapy

## 2022-11-27 ENCOUNTER — Encounter: Payer: 59 | Admitting: Physical Therapy

## 2022-12-04 ENCOUNTER — Encounter: Payer: 59 | Admitting: Physical Therapy

## 2023-01-09 ENCOUNTER — Other Ambulatory Visit: Payer: Self-pay | Admitting: Nurse Practitioner

## 2023-01-09 DIAGNOSIS — J40 Bronchitis, not specified as acute or chronic: Secondary | ICD-10-CM

## 2023-01-15 ENCOUNTER — Ambulatory Visit: Payer: Self-pay | Admitting: Adult Health

## 2023-01-15 ENCOUNTER — Ambulatory Visit (INDEPENDENT_AMBULATORY_CARE_PROVIDER_SITE_OTHER): Payer: Self-pay | Admitting: Adult Health

## 2023-01-15 DIAGNOSIS — Z0389 Encounter for observation for other suspected diseases and conditions ruled out: Secondary | ICD-10-CM

## 2023-01-15 NOTE — Progress Notes (Signed)
 Patient no show appointment. ? ?

## 2023-02-03 ENCOUNTER — Other Ambulatory Visit: Payer: Self-pay | Admitting: Nurse Practitioner

## 2023-02-03 DIAGNOSIS — J40 Bronchitis, not specified as acute or chronic: Secondary | ICD-10-CM

## 2023-02-28 ENCOUNTER — Ambulatory Visit: Payer: Medicaid Other | Admitting: Nurse Practitioner

## 2023-07-22 ENCOUNTER — Other Ambulatory Visit (HOSPITAL_COMMUNITY): Payer: Self-pay

## 2023-07-22 MED ORDER — WEGOVY 0.25 MG/0.5ML ~~LOC~~ SOAJ
0.2500 mg | SUBCUTANEOUS | 0 refills | Status: AC
Start: 2023-07-21 — End: ?
  Filled 2023-07-22 – 2023-08-09 (×4): qty 2, 28d supply, fill #0

## 2023-07-25 ENCOUNTER — Other Ambulatory Visit (HOSPITAL_COMMUNITY): Payer: Self-pay

## 2023-07-31 ENCOUNTER — Other Ambulatory Visit (HOSPITAL_COMMUNITY): Payer: Self-pay

## 2023-07-31 ENCOUNTER — Ambulatory Visit (INDEPENDENT_AMBULATORY_CARE_PROVIDER_SITE_OTHER): Payer: Self-pay | Admitting: Nurse Practitioner

## 2023-07-31 ENCOUNTER — Encounter: Payer: Self-pay | Admitting: Nurse Practitioner

## 2023-07-31 VITALS — BP 110/70 | HR 93 | Temp 98.3°F | Ht 67.0 in | Wt 320.4 lb

## 2023-07-31 DIAGNOSIS — R12 Heartburn: Secondary | ICD-10-CM

## 2023-07-31 DIAGNOSIS — E559 Vitamin D deficiency, unspecified: Secondary | ICD-10-CM | POA: Diagnosis not present

## 2023-07-31 DIAGNOSIS — R7309 Other abnormal glucose: Secondary | ICD-10-CM

## 2023-07-31 DIAGNOSIS — F419 Anxiety disorder, unspecified: Secondary | ICD-10-CM

## 2023-07-31 DIAGNOSIS — M79673 Pain in unspecified foot: Secondary | ICD-10-CM

## 2023-07-31 DIAGNOSIS — R202 Paresthesia of skin: Secondary | ICD-10-CM

## 2023-07-31 DIAGNOSIS — R200 Anesthesia of skin: Secondary | ICD-10-CM

## 2023-07-31 DIAGNOSIS — F32A Depression, unspecified: Secondary | ICD-10-CM | POA: Diagnosis not present

## 2023-07-31 DIAGNOSIS — Z0000 Encounter for general adult medical examination without abnormal findings: Secondary | ICD-10-CM

## 2023-07-31 MED ORDER — OMEPRAZOLE 20 MG PO CPDR: 20.0000 mg | DELAYED_RELEASE_CAPSULE | Freq: Every day | ORAL | 2 refills | Status: AC

## 2023-07-31 NOTE — Progress Notes (Signed)
 LILLETTE Kristeen JINNY Gladis, [name],acting as a Neurosurgeon for Gaines Ada, FNP.,have documented all relevant documentation on the behalf of Gaines Ada, FNP,as directed by  Gaines Ada, FNP while in the presence of Gaines Ada, FNP.  Subjective:    Patient ID: Rhonda Murray , female    DOB: [birth year] , 48 y.o.   MRN: [id number]  Chief Complaint  Patient presents with   Annual Exam    Patient presents today for HM, Patient reports compliance with medication. Patient denies any chest pain, SOB, or headaches. Patient has no concerns today.     HPI  Patient presents for annual examination.  Recent visit to weight loss clinic last week, on path to start wegovy , awaiting insurance authorization.  Feels that anxiety has been exacerbated by personal stressors, sees psychiatry and therapist.    Endorses stomach indigestion, bloating, this has improved with decreasing amount of acidic foods this has been better.    Starting a 1500 cal diet, low carb, low fat, high protein.  Encouraged to stop skipping meals and to increase water intake at weight loss clinic.    Endorses left arm pain that has been intermittent over the past two weeks.  Aching, soreness, numbness and tingling that occurs with laying position, bilaterally.  The pain resolves on it own without intervention.  This was new and concerning.  Works at [employer] and types all day, ergonomics discussed.       Past Medical History:  Diagnosis Date   Anxiety unknown   COVID-19 virus infection 09/2019   Maternal obesity affecting pregnancy, antepartum 02/27/2022   Morbid obesity with BMI of 45.0-49.9, adult (HCC)    Multigravida of advanced maternal age 68/19/2024   declined Panorama     Nocturia 03/18/2018   Right lower quadrant abdominal pain 03/18/2018   Sleep apnea    Tingling of skin 03/18/2018     Family History  Problem Relation Age of Onset   Stroke Mother    Hypertension Mother      Current Outpatient Medications:     amphetamine -dextroamphetamine  (ADDERALL  XR) 20 MG 24 hr capsule, Take 1 capsule (20 mg total) by mouth 2 (two) times daily., Disp: 60 capsule, Rfl: 0   amphetamine -dextroamphetamine  (ADDERALL  XR) 20 MG 24 hr capsule, Take 1 capsule (20 mg total) by mouth 2 (two) times daily., Disp: 60 capsule, Rfl: 0   escitalopram  (LEXAPRO ) 20 MG tablet, Take 1 tablet (20 mg total) by mouth daily., Disp: 30 tablet, Rfl: 5   Semaglutide -Weight Management (WEGOVY ) 0.25 MG/0.5ML SOAJ, Inject 0.25 mg into the skin once a week., Disp: 2 mL, Rfl: 0   Semaglutide -Weight Management (WEGOVY ) 0.5 MG/0.5ML SOAJ, Inject 0.5 mg into the skin once a week., Disp: 2 mL, Rfl: 0   VENTOLIN  HFA 108 (90 Base) MCG/ACT inhaler, INHALE 2 PUFFS BY MOUTH EVERY 6 HOURS AS NEEDED FOR WHEEZING OR SHORTNESS OF BREATH, Disp: 18 g, Rfl: 0   metFORMIN  (GLUCOPHAGE ) 500 MG tablet, Take 1 tablet (500 mg total) by mouth 2 (two) times daily with a meal. (Patient not taking: Reported on 07/31/2023), Disp: 180 tablet, Rfl: 1   omeprazole  (PRILOSEC) 20 MG capsule, Take 1 capsule (20 mg total) by mouth daily., Disp: 30 capsule, Rfl: 2   Vitamin D , Ergocalciferol , (DRISDOL ) 1.25 MG (50000 UNIT) CAPS capsule, Take 1 capsule (50,000 Units total) by mouth every 7 (seven) days., Disp: 12 capsule, Rfl: 1   No Known Allergies     . The patient's tobacco use is:  Social History  Tobacco Use  Smoking Status Former  Smokeless Tobacco Never  . She has been exposed to passive smoke. The patient's alcohol use is:  Social History   Substance and Sexual Activity  Alcohol Use No    Review of Systems  Constitutional:  Negative for chills, fatigue and fever.  HENT:  Negative for congestion, sinus pressure, sore throat and trouble swallowing.   Respiratory:  Positive for shortness of breath (anxiety).   Cardiovascular:  Positive for leg swelling (intermittent). Negative for chest pain and palpitations.  Gastrointestinal:  Negative for blood in stool,  constipation, diarrhea, nausea and vomiting.  Endocrine: Negative for polydipsia, polyphagia and polyuria.  Genitourinary:  Negative for difficulty urinating, dyspareunia, dysuria, vaginal bleeding, vaginal discharge and vaginal pain.  Musculoskeletal:  Positive for arthralgias (plantar facitiis , has insoles, does excersises) and myalgias (arms).  Skin:  Negative for rash and wound.  Neurological:  Positive for numbness (in arms why lying on them). Negative for dizziness, light-headedness and headaches.     Today's Vitals   07/31/23 1422  BP: 110/70  Pulse: 93  Temp: 98.3 F (36.8 C)  TempSrc: Oral  Weight: (!) 320 lb 6.4 oz (145.3 kg)  Height: 5' 7 (1.702 m)  PainSc: 0-No pain   Body mass index is 50.18 kg/m.  Wt Readings from Last 3 Encounters:  07/31/23 (!) 320 lb 6.4 oz (145.3 kg)  08/28/22 (!) 319 lb (144.7 kg)  07/27/22 (!) 322 lb 3.2 oz (146.1 kg)     Objective:  Physical Exam Vitals and nursing note reviewed.  Constitutional:      General: She is not in acute distress.    Appearance: Normal appearance. She is well-developed. She is obese.  HENT:     Head: Normocephalic and atraumatic.     Right Ear: Hearing, tympanic membrane, ear canal and external ear normal. There is no impacted cerumen.     Left Ear: Hearing, tympanic membrane, ear canal and external ear normal. There is no impacted cerumen.     Nose: Nose normal.     Mouth/Throat:     Mouth: Mucous membranes are moist.  Eyes:     General: Lids are normal.     Extraocular Movements: Extraocular movements intact.     Conjunctiva/sclera: Conjunctivae normal.     Pupils: Pupils are equal, round, and reactive to light.     Funduscopic exam:    Right eye: No papilledema.        Left eye: No papilledema.  Neck:     Thyroid : No thyroid  mass.     Vascular: No carotid bruit.  Cardiovascular:     Rate and Rhythm: Normal rate and regular rhythm.     Pulses: Normal pulses.     Heart sounds: Normal heart sounds.  No murmur heard. Pulmonary:     Effort: Pulmonary effort is normal. No respiratory distress.     Breath sounds: Normal breath sounds. No wheezing.  Chest:     Chest wall: No mass.  Breasts:    Tanner Score is 5.     Right: Normal. No mass or tenderness.     Left: Normal. No mass or tenderness.  Abdominal:     General: Abdomen is flat. Bowel sounds are normal. There is no distension.     Palpations: Abdomen is soft.     Tenderness: There is no abdominal tenderness.  Genitourinary:    Comments: Deferred - followed by GYN Musculoskeletal:        General: No swelling.  Normal range of motion.     Cervical back: Full passive range of motion without pain, normal range of motion and neck supple.     Right lower leg: No edema.     Left lower leg: No edema.  Lymphadenopathy:     Upper Body:     Right upper body: No supraclavicular, axillary or pectoral adenopathy.     Left upper body: No supraclavicular, axillary or pectoral adenopathy.  Skin:    General: Skin is warm and dry.     Capillary Refill: Capillary refill takes less than 2 seconds.  Neurological:     General: No focal deficit present.     Mental Status: She is alert and oriented to person, place, and time.     Cranial Nerves: No cranial nerve deficit.     Sensory: No sensory deficit.     Motor: No weakness.  Psychiatric:        Mood and Affect: Mood normal.        Behavior: Behavior normal.        Thought Content: Thought content normal.        Judgment: Judgment normal.         Assessment And Plan:     Encounter for annual health examination Assessment & Plan: Behavior modifications discussed and diet history reviewed.   Pt will continue to exercise regularly and modify diet with low GI, plant based foods and decrease intake of processed foods.  Recommend intake of daily multivitamin, Vitamin D , and calcium.  Recommend mammogram and colonoscopy (referral sent) for preventive screenings, as well as recommend  immunizations that include influenza, TDAP   Orders: -     CBC with Differential/Platelet -     CMP14+EGFR -     Lipid panel  Vitamin D  deficiency Assessment & Plan: Will check vitamin D  level and supplement as needed.    Also encouraged to spend 15 minutes in the sun daily.    Orders: -     VITAMIN D  25 Hydroxy (Vit-D Deficiency, Fractures)  Abnormal glucose Assessment & Plan: Continue current medications, tolerating well.   Orders: -     Hemoglobin A1c  Anxiety and depression Assessment & Plan: Continue f/u with behavioral health.    Heart burn Assessment & Plan: Will treat with Omeprazole , encouraged to limit intake of fried and fatty foods. Also to limit spicy medications.   Orders: -     Omeprazole ; Take 1 capsule (20 mg total) by mouth daily.  Dispense: 30 capsule; Refill: 2  Numbness and tingling in left arm Assessment & Plan: Endorses left arm pain that has been intermittent over the past two weeks. Aching, soreness, numbness and tingling that occurs with laying position, bilaterally. The pain resolves on it own without intervention. This was new and concerning. Works at [employer] and types all day, ergonomics discussed. Vitamin D , B12 and TSH drawn.  Patient advised to contact office if condition progresses.    Orders: -     TSH + free T4 -     Vitamin B12  Pain of foot, unspecified laterality -     Ambulatory referral to Podiatry   I have reviewed this encounter including the documentation in this note and/or discussed this patient with Delon Louder FNP Student. I am certifying that I agree with the content of this note as the primary care nurse practitioner.  Gaines Ada, DNP, FNP-BC   Return for 1 year physical, 67m pre dm. Patient was given opportunity to ask  questions. Patient verbalized understanding of the plan and was able to repeat key elements of the plan. All questions were answered to their satisfaction.   Gaines Ada, FNP  I, Gaines Ada, FNP, have reviewed all documentation for this visit. The documentation on 07/31/23 for the exam, diagnosis, procedures, and orders are all accurate and complete.

## 2023-07-31 NOTE — Assessment & Plan Note (Signed)
 Endorses left arm pain that has been intermittent over the past two weeks. Aching, soreness, numbness and tingling that occurs with laying position, bilaterally. The pain resolves on it own without intervention. This was new and concerning. Works at [employer] and types all day, ergonomics discussed. Vitamin D , B12 and TSH drawn.  Patient advised to contact office if condition progresses.

## 2023-07-31 NOTE — Patient Instructions (Signed)
 Wear wrist splint at night to see if this helps with your arm and wrist pain.  Please call out office is this worsens, does not resolve as it has before.

## 2023-08-01 ENCOUNTER — Ambulatory Visit: Payer: Self-pay | Admitting: Nurse Practitioner

## 2023-08-01 LAB — CMP14+EGFR
ALT: 18 IU/L (ref 0–32)
AST: 20 IU/L (ref 0–40)
Albumin: 4 g/dL (ref 3.9–4.9)
Alkaline Phosphatase: 119 IU/L (ref 44–121)
BUN/Creatinine Ratio: 9 (ref 9–23)
BUN: 8 mg/dL (ref 6–24)
Bilirubin Total: 0.3 mg/dL (ref 0.0–1.2)
CO2: 22 mmol/L (ref 20–29)
Calcium: 9.4 mg/dL (ref 8.7–10.2)
Chloride: 101 mmol/L (ref 96–106)
Creatinine, Ser: 0.87 mg/dL (ref 0.57–1.00)
Globulin, Total: 3.5 g/dL (ref 1.5–4.5)
Glucose: 87 mg/dL (ref 70–99)
Potassium: 3.7 mmol/L (ref 3.5–5.2)
Sodium: 139 mmol/L (ref 134–144)
Total Protein: 7.5 g/dL (ref 6.0–8.5)
eGFR: 83 mL/min/1.73 (ref >59)

## 2023-08-01 LAB — CBC WITH DIFFERENTIAL/PLATELET
Basophils Absolute: 0 x10E3/uL (ref 0.0–0.2)
Basos: 0 %
EOS (ABSOLUTE): 0.1 x10E3/uL (ref 0.0–0.4)
Eos: 1 %
Hematocrit: 40.3 % (ref 34.0–46.6)
Hemoglobin: 12.9 g/dL (ref 11.1–15.9)
Immature Grans (Abs): 0 x10E3/uL (ref 0.0–0.1)
Immature Granulocytes: 0 %
Lymphocytes Absolute: 2.9 x10E3/uL (ref 0.7–3.1)
Lymphs: 32 %
MCH: 30.1 pg (ref 26.6–33.0)
MCHC: 32 g/dL (ref 31.5–35.7)
MCV: 94 fL (ref 79–97)
Monocytes Absolute: 0.6 x10E3/uL (ref 0.1–0.9)
Monocytes: 7 %
Neutrophils Absolute: 5.4 x10E3/uL (ref 1.4–7.0)
Neutrophils: 60 %
Platelets: 373 x10E3/uL (ref 150–450)
RBC: 4.29 x10E6/uL (ref 3.77–5.28)
RDW: 13.1 % (ref 11.7–15.4)
WBC: 9 x10E3/uL (ref 3.4–10.8)

## 2023-08-01 LAB — LIPID PANEL
Chol/HDL Ratio: 3.6 ratio (ref 0.0–4.4)
Cholesterol, Total: 165 mg/dL (ref 100–199)
HDL: 46 mg/dL (ref >39)
LDL Chol Calc (NIH): 98 mg/dL (ref 0–99)
Triglycerides: 116 mg/dL (ref 0–149)
VLDL Cholesterol Cal: 21 mg/dL (ref 5–40)

## 2023-08-01 LAB — HEMOGLOBIN A1C
Est. average glucose Bld gHb Est-mCnc: 114 mg/dL
Hgb A1c MFr Bld: 5.6 % (ref 4.8–5.6)

## 2023-08-01 LAB — VITAMIN B12: Vitamin B-12: 650 pg/mL (ref 232–1245)

## 2023-08-01 LAB — TSH+FREE T4
Free T4: 1.22 ng/dL (ref 0.82–1.77)
TSH: 1.88 u[IU]/mL (ref 0.450–4.500)

## 2023-08-01 LAB — VITAMIN D 25 HYDROXY (VIT D DEFICIENCY, FRACTURES): Vit D, 25-Hydroxy: 23.3 ng/mL — ABNORMAL LOW (ref 30.0–100.0)

## 2023-08-02 ENCOUNTER — Other Ambulatory Visit: Payer: Self-pay | Admitting: Nurse Practitioner

## 2023-08-02 DIAGNOSIS — E559 Vitamin D deficiency, unspecified: Secondary | ICD-10-CM

## 2023-08-02 MED ORDER — VITAMIN D (ERGOCALCIFEROL) 1.25 MG (50000 UNIT) PO CAPS: 50000.0000 [IU] | ORAL_CAPSULE | ORAL | 1 refills | Status: AC

## 2023-08-08 NOTE — Assessment & Plan Note (Signed)
 Will check vitamin D level and supplement as needed.    Also encouraged to spend 15 minutes in the sun daily.

## 2023-08-08 NOTE — Assessment & Plan Note (Signed)
 Will treat with Omeprazole , encouraged to limit intake of fried and fatty foods. Also to limit spicy medications.

## 2023-08-08 NOTE — Assessment & Plan Note (Signed)
Behavior modifications discussed and diet history reviewed.   Pt will continue to exercise regularly and modify diet with low GI, plant based foods and decrease intake of processed foods.  Recommend intake of daily multivitamin, Vitamin D, and calcium.  Recommend mammogram and colonoscopy (referral sent) for preventive screenings, as well as recommend immunizations that include influenza, TDAP

## 2023-08-08 NOTE — Assessment & Plan Note (Signed)
Continue f/u with behavioral health.

## 2023-08-08 NOTE — Assessment & Plan Note (Signed)
 Continue current medications, tolerating well.

## 2023-08-09 ENCOUNTER — Other Ambulatory Visit (HOSPITAL_COMMUNITY): Payer: Self-pay

## 2023-08-17 ENCOUNTER — Encounter: Payer: Self-pay | Admitting: Podiatry

## 2023-08-17 ENCOUNTER — Telehealth: Payer: Self-pay | Admitting: Podiatry

## 2023-08-17 ENCOUNTER — Ambulatory Visit: Admitting: Podiatry

## 2023-08-17 ENCOUNTER — Ambulatory Visit

## 2023-08-17 DIAGNOSIS — M7671 Peroneal tendinitis, right leg: Secondary | ICD-10-CM

## 2023-08-17 DIAGNOSIS — M7661 Achilles tendinitis, right leg: Secondary | ICD-10-CM | POA: Diagnosis not present

## 2023-08-17 DIAGNOSIS — M76821 Posterior tibial tendinitis, right leg: Secondary | ICD-10-CM

## 2023-08-17 DIAGNOSIS — M7662 Achilles tendinitis, left leg: Secondary | ICD-10-CM | POA: Diagnosis not present

## 2023-08-17 DIAGNOSIS — M7672 Peroneal tendinitis, left leg: Secondary | ICD-10-CM | POA: Diagnosis not present

## 2023-08-17 MED ORDER — MELOXICAM 15 MG PO TABS: 15.0000 mg | ORAL_TABLET | Freq: Every day | ORAL | 0 refills | Status: AC

## 2023-08-17 NOTE — Telephone Encounter (Signed)
 Patient requests a note stating she can wear sneakers to work and park closer to the building. She works at [employer] in [employer] and [employer] starts soon.

## 2023-08-17 NOTE — Patient Instructions (Signed)

## 2023-08-17 NOTE — Progress Notes (Signed)
 Chief Complaint  Patient presents with   Foot Pain    bilateral foot pain with pain mainly in heel and sometimes up in achilles.  Over a year. [facility] to [facility] to try orthotics. Pain 7 Pre Diabetic    HPI: 48 y.o. female presents today with pain to the back of bilateral heels.  Is been ongoing for close to a year.  She has tried some over-the-counter inserts.  Pain is aggravated with weightbearing activity.  Especially sore in the morning.  Past Medical History:  Diagnosis Date   Anxiety unknown   COVID-19 virus infection 09/2019   Maternal obesity affecting pregnancy, antepartum 02/27/2022   Morbid obesity with BMI of 45.0-49.9, adult (HCC)    Multigravida of advanced maternal age 89/19/2024   declined Panorama     Nocturia 03/18/2018   Right lower quadrant abdominal pain 03/18/2018   Sleep apnea    Tingling of skin 03/18/2018    Past Surgical History:  Procedure Laterality Date   CESAREAN SECTION N/A 11/09/2014   Procedure: CESAREAN SECTION;  Surgeon: Nathanel Bunker, MD;  Location: [facility];  Service: Obstetrics;  Laterality: N/A;   LEEP     MASS EXCISION N/A 04/24/2019   Procedure: EXCISION OF ABDOMINAL WALL MASS;  Surgeon: Vanderbilt Ned, MD;  Location:  [facility];  Service: General;  Laterality: N/A;    No Known Allergies  ROS    Physical Exam: There were no vitals filed for this visit.  General: The patient is alert and oriented x3 in no acute distress.  Dermatology: Skin is warm, dry and supple bilateral lower extremities. Interspaces are clear of maceration and debris.    Vascular: Palpable pedal pulses bilaterally. Capillary refill within normal limits.  No appreciable edema.  No erythema or calor.  Neurological: Light touch sensation grossly intact bilateral feet.   Musculoskeletal Exam: Muscle strength 5/5 for all major muscle groups.  Pain on palpation of posterior Achilles at level of insertion bilaterally.  Associated warmth and  localized inflammation present.  No palpable dell present.  Radiographic Exam: Left and right foot radiographs 3 views weightbearing 08/17/2023 Normal osseous mineralization. Joint spaces preserved.  No fractures or acute osseous irregularities noted.  Heel spur formation present posterior calcaneus bilaterally at level of Achilles insertion.  No change to Cager's triangle bilaterally.  Assessment/Plan of Care: 1. Achilles tendinitis of both lower extremities      Meds ordered this encounter  Medications   meloxicam  (MOBIC ) 15 MG tablet    Sig: Take 1 tablet (15 mg total) by mouth daily.    Dispense:  30 tablet    Refill:  0   None  Discussed clinical findings with patient today.  Discussed the etiology and treatment options for achilles tendinitis.  We discussed that these types of injuries are overuse injuries and respond well to anti-inflammatories, rest, immobilization.  I reviewed RICE protocol with the patient.  Recommend the following treatment plan:  -Given bilateral nature dispensing two heel lifts for each foot. Achilles tendinitis padded sleeves dispensed as well. -Patient instructed to rest and limit excessive ambulation -Oral anti-inflammatories prescribed: oral meloxicam  -Recommended topical diclofenac gel 1% to apply daily 3-4 times on the painful areas -Recommended twice daily stretching through these exercises reviewed with the patient and an informational handout was given to the patient. - Will reevaluate in 3 weeks.  Did discuss potential need for cam boot immobilization if we can isolate more severe side as well as  possibility of physical therapy.    Cash [place] L. Lamount MAUL, AACFAS [facility]     [address] [place], [place] [address]                Office [phone]  Fax [phone]

## 2023-08-18 ENCOUNTER — Other Ambulatory Visit (HOSPITAL_COMMUNITY): Payer: Self-pay

## 2023-08-18 MED ORDER — WEGOVY 0.5 MG/0.5ML ~~LOC~~ SOAJ
0.5000 mg | SUBCUTANEOUS | 0 refills | Status: AC
  Filled 2023-08-20 – 2023-08-31 (×2): qty 2, 28d supply, fill #0

## 2023-08-20 ENCOUNTER — Other Ambulatory Visit (HOSPITAL_COMMUNITY): Payer: Self-pay

## 2023-08-23 ENCOUNTER — Encounter: Payer: Self-pay | Admitting: Podiatry

## 2023-08-23 NOTE — Telephone Encounter (Signed)
 Sent mess to pt via MyChart to confirm her email address. She is req a letter for her to get a parking pass to be able to park closer to building.

## 2023-08-23 NOTE — Telephone Encounter (Signed)
 Email confirmed with pt and I emailed and sent mess to her via MyChart

## 2023-08-29 NOTE — Telephone Encounter (Signed)
 The pt is unable to retrieve the letter- see prev notes via her email. I will send to her via MyChart.

## 2023-08-30 ENCOUNTER — Encounter: Payer: Self-pay | Admitting: Podiatry

## 2023-08-31 ENCOUNTER — Other Ambulatory Visit (HOSPITAL_COMMUNITY): Payer: Self-pay

## 2023-09-12 NOTE — Telephone Encounter (Signed)
 LMOM for pt to confirm she had picked up the work note and that confirm we will see her on Friday. CT

## 2023-09-14 ENCOUNTER — Encounter: Payer: Self-pay | Admitting: Podiatry

## 2023-09-14 ENCOUNTER — Ambulatory Visit: Admitting: Podiatry

## 2023-09-14 DIAGNOSIS — M7662 Achilles tendinitis, left leg: Secondary | ICD-10-CM

## 2023-09-14 DIAGNOSIS — M7661 Achilles tendinitis, right leg: Secondary | ICD-10-CM | POA: Diagnosis not present

## 2023-09-14 NOTE — Patient Instructions (Signed)

## 2023-09-14 NOTE — Progress Notes (Signed)
 Chief Complaint  Patient presents with   Foot Pain    Bilateral pain has improved.  Has been using insoles and wearing tennis shoes.  Doing stretches but not twice a day.  Non diabetic No anti coag    HPI: 48 y.o. female presents following up for bilateral Achilles tendinitis.  She reports mixed adherence to stretching regimen.  She has had some good relief of her symptoms, reports about 50% improvement.  Has been using felt heel pads and good shoes.  Past Medical History:  Diagnosis Date   Anxiety unknown   COVID-19 virus infection 09/2019   Maternal obesity affecting pregnancy, antepartum 02/27/2022   Morbid obesity with BMI of 45.0-49.9, adult (HCC)    Multigravida of advanced maternal age 32/19/2024   declined Panorama     Nocturia 03/18/2018   Right lower quadrant abdominal pain 03/18/2018   Sleep apnea    Tingling of skin 03/18/2018    Past Surgical History:  Procedure Laterality Date   CESAREAN SECTION N/A 11/09/2014   Procedure: CESAREAN SECTION;  Surgeon: Nathanel Bunker, MD;  Location: [facility];  Service: Obstetrics;  Laterality: N/A;   LEEP     MASS EXCISION N/A 04/24/2019   Procedure: EXCISION OF ABDOMINAL WALL MASS;  Surgeon: Vanderbilt Ned, MD;  Location: [facility];  Service: General;  Laterality: N/A;    No Known Allergies  ROS    Physical Exam: There were no vitals filed for this visit.  General: The patient is alert and oriented x3 in no acute distress.  Dermatology: Skin is warm, dry and supple bilateral lower extremities. Interspaces are clear of maceration and debris.    Vascular: Palpable pedal pulses bilaterally. Capillary refill within normal limits.  No appreciable edema.  No erythema or calor.  Neurological: Light touch sensation grossly intact bilateral feet.   Musculoskeletal Exam: Muscle strength 5/5 for all major muscle groups.  Decreased pain on palpation of posterior Achilles at level of insertion bilaterally.   Decreased associated warmth and localized inflammation present.  No palpable dell present.  Radiographic Exam: Left and right foot radiographs 3 views weightbearing 08/17/2023 Normal osseous mineralization. Joint spaces preserved.  No fractures or acute osseous irregularities noted.  Heel spur formation present posterior calcaneus bilaterally at level of Achilles insertion.  No change to Cager's triangle bilaterally.  Assessment/Plan of Care: 1. Achilles tendinitis of both lower extremities      No orders of the defined types were placed in this encounter.  None  Discussed clinical findings with patient today.  Reviewed the etiology and treatment options for achilles tendinitis.  We discussed that these types of injuries are overuse injuries and respond well to anti-inflammatories, rest, immobilization.  I reviewed RICE protocol with the patient.  Recommend the following treatment plan:  - Continue with the use of over-the-counter heel lifts to help offload the Achilles tendon. -Did discuss that these injuries often respond well to immobilization with stretching and strengthening regimen, will consider cam boot going forward if symptoms worsen, linger or fail to improve. -Patient instructed to rest and limit excessive ambulation - Complete course of oral meloxicam .  May transition to over-the-counter Aleve  when she completes her course. -Recommended topical diclofenac gel 1% to apply daily 3-4 times on the painful areas -Recommended twice daily stretching through these exercises reviewed with the patient and an informational handout was given to the patient. - Will reevaluate in 4 weeks.  Did discuss potential need for cam boot  immobilization if we can isolate more severe side as well as possibility of physical therapy.    Aaralynn Shepheard L. Lamount MAUL, AACFAS [facility]     [address] [place], [place] [address]                Office [phone]  Fax [phone]

## 2023-09-22 ENCOUNTER — Other Ambulatory Visit (HOSPITAL_COMMUNITY): Payer: Self-pay

## 2023-09-22 MED ORDER — WEGOVY 1 MG/0.5ML ~~LOC~~ SOAJ
1.0000 mg | SUBCUTANEOUS | 0 refills | Status: AC
  Filled 2023-09-22: qty 2, 28d supply, fill #0

## 2023-09-24 ENCOUNTER — Other Ambulatory Visit (HOSPITAL_COMMUNITY): Payer: Self-pay

## 2023-09-24 MED ORDER — VITAMIN D3 25 MCG PO TABS
1000.0000 [IU] | ORAL_TABLET | Freq: Every day | ORAL | 0 refills | Status: AC
  Filled 2023-09-24: qty 90, 90d supply, fill #0

## 2023-10-12 ENCOUNTER — Ambulatory Visit: Admitting: Podiatry

## 2023-10-25 ENCOUNTER — Encounter (HOSPITAL_COMMUNITY): Payer: Self-pay

## 2023-10-25 ENCOUNTER — Other Ambulatory Visit (HOSPITAL_COMMUNITY): Payer: Self-pay

## 2023-10-25 MED ORDER — MOUNJARO 2.5 MG/0.5ML ~~LOC~~ SOAJ
2.5000 mg | SUBCUTANEOUS | 0 refills | Status: DC
  Filled 2023-10-25: qty 2, 28d supply, fill #0

## 2023-11-06 ENCOUNTER — Other Ambulatory Visit (HOSPITAL_COMMUNITY): Payer: Self-pay

## 2023-11-08 ENCOUNTER — Encounter: Payer: Self-pay | Admitting: Podiatry

## 2023-11-08 ENCOUNTER — Ambulatory Visit: Admitting: Podiatry

## 2023-11-08 VITALS — Ht 67.0 in | Wt 320.0 lb

## 2023-11-08 DIAGNOSIS — G5781 Other specified mononeuropathies of right lower limb: Secondary | ICD-10-CM | POA: Diagnosis not present

## 2023-11-08 DIAGNOSIS — M7661 Achilles tendinitis, right leg: Secondary | ICD-10-CM

## 2023-11-08 DIAGNOSIS — M7662 Achilles tendinitis, left leg: Secondary | ICD-10-CM

## 2023-11-08 MED ORDER — BETAMETHASONE SOD PHOS & ACET 6 (3-3) MG/ML IJ SUSP
12.0000 mg | Freq: Once | INTRAMUSCULAR | Status: AC
  Administered 2023-11-08: 12 mg via INTRAMUSCULAR

## 2023-11-08 NOTE — Progress Notes (Signed)
 "  Subjective:  Patient ID: Rhonda Murray, female    DOB: 1975/07/20,  MRN: [id number]  Chief Complaint  Patient presents with   Foot Pain    Pt is here to f/u on bilateral foot pain, she states she has not had any pain to the heels of both feet, states she has been having pain/ soreness to the ball of the right foot. No other complaints.    Discussed the use of AI scribe software for clinical note transcription with the patient, who gave verbal consent to proceed.  History of Present Illness Rhonda Murray is a 48 year old female who presents following up for bilateral Achilles tendinitis.  She is doing well with this.  She does note new right foot pain over the past week.  She experiences pain localized to the ball of her right foot, which began over the past week. The pain feels like she is standing on it, sometimes locks, or feels like she stepped on something when there is nothing there. It does not radiate to her toes and feels like her sock is bunched up or like she is walking on a pebble. The pain is most noticeable when standing for prolonged periods or when removing her shoes, and it aches and hurts at those times.  She has been using heel lifts daily, which may have contributed to a change in her walking pattern. She took medication for the pain today but did not specify the type or dosage. There is no pain on the left side, and she has no other significant symptoms.      Objective:    Physical Exam General: The patient is alert and oriented x3 in no acute distress.   Dermatology: Skin is warm, dry and supple bilateral lower extremities. Interspaces are clear of maceration and debris.     Vascular: Palpable pedal pulses bilaterally. Capillary refill within normal limits.  No appreciable edema.  No erythema or calor.   Neurological: Light touch sensation grossly intact bilateral feet.    Musculoskeletal Exam: Muscle strength 5/5 for all major muscle groups.  No pain on  palpation of the bilateral Achilles tendon or to the posterior heel.  Forefoot pain present right foot plantarly localized with dorsal and plantar compression of the third interspace.  Negative Mulder's click.   No images are attached to the encounter.    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 Achilles tendinitis of both lower extremities      Plan:  Patient was evaluated and treated and all questions answered.  Assessment and Plan Assessment & Plan Right foot neuroma symptoms Localized tenderness in the right third interspace with dorsal to plantar compression suggests neuroma. Symptoms include a sensation of standing on a pebble, especially after prolonged standing or shoe removal. Differential diagnosis includes neuroma due to nerve inflammation, possibly exacerbated by altered gait from previous Achilles tendinitis. - Administer steroid injection to the right foot to reduce inflammation. - Discontinue use of heel lifts. - Apply metatarsal pads to the ball of the foot for 2-3 weeks. - Educated on purchasing additional metatarsal pads from [employer]. - Advise wearing supportive sneakers.  Bilateral Achilles tendinitis, nearly resolved Bilateral Achilles tendinitis has been present for several months and is now nearly resolved. Improvement noted with current treatment regimen. - Continue Achilles stretching exercises. - Continue to use supportive shoe gear, gradual return to activity.  Monitor for signs of recurrence. - Continue  over-the-counter NSAIDs as needed. -I certify that this diagnosis represents a distinct and separate diagnosis that requires evaluation and treatment separate from other procedures or diagnosis    Procedure: Injection neuroma right third interspace Discussed alternatives, risks, complications and verbal consent was obtained.  Location: Right third interspace dorsal plantar. Skin Prep: Alcohol. Injectate: One-to-one  ratio of 1.5 cc of 0.5% Marcaine  mixed with betamethasone  soluspan Disposition: Patient tolerated procedure well. Injection site dressed with a band-aid.  Post-injection care was discussed and return precautions discussed.     Return in about 4 weeks (around 12/06/2023) for metatarsalgia/neuroma.    "

## 2023-11-08 NOTE — Patient Instructions (Signed)
 Achilles Tendinitis  with Rehab Achilles tendinitis is a disorder of the Achilles tendon. The Achilles tendon connects the large calf muscles (Gastrocnemius and Soleus) to the heel bone (calcaneus). This tendon is sometimes called the heel cord. It is important for pushing-off and standing on your toes and is important for walking, running, or jumping. Tendinitis is often caused by overuse and repetitive microtrauma. SYMPTOMS Pain, tenderness, swelling, warmth, and redness may occur over the Achilles tendon even at rest. Pain with pushing off, or flexing or extending the ankle. Pain that is worsened after or during activity. CAUSES  Overuse sometimes seen with rapid increase in exercise programs or in sports requiring running and jumping. Poor physical conditioning (strength and flexibility or endurance). Running sports, especially training running down hills. Inadequate warm-up before practice or play or failure to stretch before participation. Injury to the tendon. PREVENTION  Warm up and stretch before practice or competition. Allow time for adequate rest and recovery between practices and competition. Keep up conditioning. Keep up ankle and leg flexibility. Improve or keep muscle strength and endurance. Improve cardiovascular fitness. Use proper technique. Use proper equipment (shoes, skates). To help prevent recurrence, taping, protective strapping, or an adhesive bandage may be recommended for several weeks after healing is complete. PROGNOSIS  Recovery may take weeks to several months to heal. Longer recovery is expected if symptoms have been prolonged. Recovery is usually quicker if the inflammation is due to a direct blow as compared with overuse or sudden strain. RELATED COMPLICATIONS  Healing time will be prolonged if the condition is not correctly treated. The injury must be given plenty of time to heal. Symptoms can reoccur if activity is resumed too soon. Untreated,  tendinitis may increase the risk of tendon rupture requiring additional time for recovery and possibly surgery. TREATMENT  The first treatment consists of rest anti-inflammatory medication, and ice to relieve the pain. Stretching and strengthening exercises after resolution of pain will likely help reduce the risk of recurrence. Referral to a physical therapist or athletic trainer for further evaluation and treatment may be helpful. A walking boot or cast may be recommended to rest the Achilles tendon. This can help break the cycle of inflammation and microtrauma. Arch supports (orthotics) may be prescribed or recommended by your caregiver as an adjunct to therapy and rest. Surgery to remove the inflamed tendon lining or degenerated tendon tissue is rarely necessary and has shown less than predictable results. MEDICATION  Nonsteroidal anti-inflammatory medications, such as aspirin and ibuprofen , may be used for pain and inflammation relief. Do not take within 7 days before surgery. Take these as directed by your caregiver. Contact your caregiver immediately if any bleeding, stomach upset, or signs of allergic reaction occur. Other minor pain relievers, such as acetaminophen , may also be used. Pain relievers may be prescribed as necessary by your caregiver. Do not take prescription pain medication for longer than 4 to 7 days. Use only as directed and only as much as you need. Cortisone injections are rarely indicated. Cortisone injections may weaken tendons and predispose to rupture. It is better to give the condition more time to heal than to use them. HEAT AND COLD Cold is used to relieve pain and reduce inflammation for acute and chronic Achilles tendinitis. Cold should be applied for 10 to 15 minutes every 2 to 3 hours for inflammation and pain and immediately after any activity that aggravates your symptoms. Use ice packs or an ice massage. Heat may be used before performing stretching  and  strengthening activities prescribed by your caregiver. Use a heat pack or a warm soak. SEEK MEDICAL CARE IF: Symptoms get worse or do not improve in 2 weeks despite treatment. New, unexplained symptoms develop. Drugs used in treatment may produce side effects.  EXERCISES:  RANGE OF MOTION (ROM) AND STRETCHING EXERCISES - Achilles Tendinitis  These exercises may help you when beginning to rehabilitate your injury. Your symptoms may resolve with or without further involvement from your physician, physical therapist or athletic trainer. While completing these exercises, remember:  Restoring tissue flexibility helps normal motion to return to the joints. This allows healthier, less painful movement and activity. An effective stretch should be held for at least 30 seconds. A stretch should never be painful. You should only feel a gentle lengthening or release in the stretched tissue.  STRETCH  Gastroc, Standing  Place hands on wall. Extend right / left leg, keeping the front knee somewhat bent. Slightly point your toes inward on your back foot. Keeping your right / left heel on the floor and your knee straight, shift your weight toward the wall, not allowing your back to arch. You should feel a gentle stretch in the right / left calf. Hold this position for 10 seconds. Repeat 3 times. Complete this stretch 2 times per day.  STRETCH  Soleus, Standing  Place hands on wall. Extend right / left leg, keeping the other knee somewhat bent. Slightly point your toes inward on your back foot. Keep your right / left heel on the floor, bend your back knee, and slightly shift your weight over the back leg so that you feel a gentle stretch deep in your back calf. Hold this position for 10 seconds. Repeat 3 times. Complete this stretch 2 times per day.  STRETCH  Gastrocsoleus, Standing  Note: This exercise can place a lot of stress on your foot and ankle. Please complete this exercise only if specifically  instructed by your caregiver.  Place the ball of your right / left foot on a step, keeping your other foot firmly on the same step. Hold on to the wall or a rail for balance. Slowly lift your other foot, allowing your body weight to press your heel down over the edge of the step. You should feel a stretch in your right / left calf. Hold this position for 10 seconds. Repeat this exercise with a slight bend in your knee. Repeat 3 times. Complete this stretch 2 times per day.   STRENGTHENING EXERCISES - Achilles Tendinitis These exercises may help you when beginning to rehabilitate your injury. They may resolve your symptoms with or without further involvement from your physician, physical therapist or athletic trainer. While completing these exercises, remember:  Muscles can gain both the endurance and the strength needed for everyday activities through controlled exercises. Complete these exercises as instructed by your physician, physical therapist or athletic trainer. Progress the resistance and repetitions only as guided. You may experience muscle soreness or fatigue, but the pain or discomfort you are trying to eliminate should never worsen during these exercises. If this pain does worsen, stop and make certain you are following the directions exactly. If the pain is still present after adjustments, discontinue the exercise until you can discuss the trouble with your clinician.  STRENGTH - Plantar-flexors  Sit with your right / left leg extended. Holding onto both ends of a rubber exercise band/tubing, loop it around the ball of your foot. Keep a slight tension in the band. Slowly  push your toes away from you, pointing them downward. Hold this position for 10 seconds. Return slowly, controlling the tension in the band/tubing. Repeat 3 times. Complete this exercise 2 times per day.   STRENGTH - Plantar-flexors  Stand with your feet shoulder width apart. Steady yourself with a wall or table  using as little support as needed. Keeping your weight evenly spread over the width of your feet, rise up on your toes.* Hold this position for 10 seconds. Repeat 3 times. Complete this exercise 2 times per day.  *If this is too easy, shift your weight toward your right / left leg until you feel challenged. Ultimately, you may be asked to do this exercise with your right / left foot only.  STRENGTH  Plantar-flexors, Eccentric  Note: This exercise can place a lot of stress on your foot and ankle. Please complete this exercise only if specifically instructed by your caregiver.  Place the balls of your feet on a step. With your hands, use only enough support from a wall or rail to keep your balance. Keep your knees straight and rise up on your toes. Slowly shift your weight entirely to your right / left toes and pick up your opposite foot. Gently and with controlled movement, lower your weight through your right / left foot so that your heel drops below the level of the step. You will feel a slight stretch in the back of your calf at the end position. Use the healthy leg to help rise up onto the balls of both feet, then lower weight only on the right / left leg again. Build up to 15 repetitions. Then progress to 3 consecutive sets of 15 repetitions.* After completing the above exercise, complete the same exercise with a slight knee bend (about 30 degrees). Again, build up to 15 repetitions. Then progress to 3 consecutive sets of 15 repetitions.* Perform this exercise 2 times per day.  *When you easily complete 3 sets of 15, your physician, physical therapist or athletic trainer may advise you to add resistance by wearing a backpack filled with additional weight.  STRENGTH - Plantar Flexors, Seated  Sit on a chair that allows your feet to rest flat on the ground. If necessary, sit at the edge of the chair. Keeping your toes firmly on the ground, lift your right / left heel as far as you can without  increasing any discomfort in your ankle. Repeat 3 times. Complete this exercise 2 times a day.   Continue to offload the balls of your feet with metatarsal pads. You can find more of these on [employer].

## 2023-11-19 ENCOUNTER — Other Ambulatory Visit (HOSPITAL_COMMUNITY): Payer: Self-pay

## 2023-11-19 ENCOUNTER — Encounter (HOSPITAL_COMMUNITY): Payer: Self-pay

## 2023-11-19 MED ORDER — ORLISTAT 120 MG PO CAPS
120.0000 mg | ORAL_CAPSULE | Freq: Every day | ORAL | 0 refills | Status: DC
  Filled 2023-11-19 – 2023-12-10 (×3): qty 30, 30d supply, fill #0

## 2023-11-21 ENCOUNTER — Other Ambulatory Visit (HOSPITAL_COMMUNITY): Payer: Self-pay

## 2023-11-22 ENCOUNTER — Other Ambulatory Visit (HOSPITAL_COMMUNITY): Payer: Self-pay

## 2023-11-27 ENCOUNTER — Other Ambulatory Visit (HOSPITAL_COMMUNITY): Payer: Self-pay

## 2023-11-27 MED ORDER — POTASSIUM CHLORIDE ER 10 MEQ PO TBCR
10.0000 meq | EXTENDED_RELEASE_TABLET | Freq: Every day | ORAL | 0 refills | Status: AC
  Filled 2023-11-27 – 2023-12-10 (×2): qty 30, 30d supply, fill #0

## 2023-11-30 LAB — HM COLONOSCOPY

## 2023-12-04 ENCOUNTER — Other Ambulatory Visit (HOSPITAL_COMMUNITY): Payer: Self-pay

## 2023-12-10 ENCOUNTER — Other Ambulatory Visit (HOSPITAL_COMMUNITY): Payer: Self-pay

## 2023-12-13 ENCOUNTER — Ambulatory Visit: Admitting: Podiatry

## 2023-12-31 ENCOUNTER — Other Ambulatory Visit (HOSPITAL_COMMUNITY): Payer: Self-pay

## 2023-12-31 MED ORDER — ORLISTAT 120 MG PO CAPS
120.0000 mg | ORAL_CAPSULE | Freq: Every day | ORAL | 0 refills | Status: AC
  Filled 2024-01-02: qty 30, 30d supply, fill #0

## 2024-01-01 ENCOUNTER — Other Ambulatory Visit (HOSPITAL_COMMUNITY): Payer: Self-pay

## 2024-01-02 ENCOUNTER — Other Ambulatory Visit (HOSPITAL_COMMUNITY): Payer: Self-pay

## 2024-01-14 ENCOUNTER — Ambulatory Visit: Admitting: Podiatry

## 2024-01-14 ENCOUNTER — Other Ambulatory Visit (HOSPITAL_COMMUNITY): Payer: Self-pay

## 2024-01-14 DIAGNOSIS — M7662 Achilles tendinitis, left leg: Secondary | ICD-10-CM | POA: Diagnosis not present

## 2024-01-14 DIAGNOSIS — M7661 Achilles tendinitis, right leg: Secondary | ICD-10-CM

## 2024-01-14 NOTE — Patient Instructions (Signed)

## 2024-01-14 NOTE — Progress Notes (Signed)
"  °  Subjective:  Patient ID: Rhonda Murray, female    DOB: September 06, 1975,  MRN: [id number]  Chief Complaint  Patient presents with   Foot Pain    Bilateral foot pain mostly right ankle medial.  Patient states 85% improvement. Notices mor ankle pain when not wearing tennis shoes. No pain reported in ball of foot.     Discussed the use of AI scribe software for clinical note transcription with the patient, who gave verbal consent to proceed.  History of Present Illness Rhonda Murray is a 49 year old female with bilateral Achilles tendinitis who presents for follow-up of persistent right heel and ankle pain.  She notes about 85% overall improvement in Achilles symptoms. Current pain is mild, intermittent, and localized to the posterior medial heel, with no central or lateral heel pain.  She ambulates in sneakers with heel lifts, which have significantly improved function and reduced post-activity soreness. She performs home stretching but has not done formal physical therapy. She uses analgesics only as needed and infrequently.  Prior ball-of-foot pain and neuroma symptoms have resolved. She prefers cushioned full-length inserts and finds rigid three-quarter plastic shell inserts uncomfortable.      Objective:    Physical Exam MUSCULOSKELETAL: Mild tenderness on palpation of the right Achilles tendon. Tenderness at the posterior medial aspect of the right heel at the Achilles insertion. Neurovascular status intact Normal skin texture and skin turgor without skin lesions.   No images are attached to the encounter.    Results Radiology Foot X-ray: Calcaneal enthesophyte at Achilles tendon insertion   Assessment:   1. Achilles tendinitis of both lower extremities      Plan:  Patient was evaluated and treated and all questions answered.  Assessment and Plan Assessment & Plan Achilles tendinitis of both lower extremities Chronic bilateral Achilles tendinitis with significant  improvement. Mild, intermittent right-sided insertional pain persists.  Pain mostly posterior medial at this point.  Symptoms attributed to tendon irritation, not osseous pathology. More invasive interventions not indicated given improvement. - Referred to physical therapy for bilateral insertional Achilles tendinitis.  Evaluation and treatment as indicated, modalities as indicated - Recommended continued use of supportive sneakers and heel lifts as tolerated. -Continue meloxicam  as needed - Discussed transition to over-the-counter orthotic inserts with neutral arch and increased heel control; provided information on brands and models, with options for in-office purchase or written recommendations. - Advised orthotics likely HSA/FSA eligible and provided guidance on selection based on foot type and pain location. - Reviewed long-term management strategies, including ongoing attention to footwear and possible future use of orthotics rather than heel lifts. - Planned follow-up in six weeks after initiation of physical therapy.      Return in about 6 weeks (around 02/25/2024) for achilles tendinitis.    "

## 2024-01-15 ENCOUNTER — Encounter: Payer: Self-pay | Admitting: Podiatry

## 2024-01-23 ENCOUNTER — Ambulatory Visit: Admitting: Physical Therapy

## 2024-01-30 ENCOUNTER — Ambulatory Visit: Payer: Self-pay | Admitting: Nurse Practitioner

## 2024-02-20 ENCOUNTER — Ambulatory Visit: Admitting: Nurse Practitioner

## 2024-02-29 ENCOUNTER — Ambulatory Visit: Admitting: Podiatry

## 2024-08-06 ENCOUNTER — Encounter: Payer: Self-pay | Admitting: Nurse Practitioner
